# Patient Record
Sex: Male | Born: 1945 | Hispanic: No | Marital: Single | Smoking: Never smoker
Health system: Southern US, Community
[De-identification: ages and names within clinical notes are randomized; demographics above are authoritative.]

## PROBLEM LIST (undated history)

## (undated) DIAGNOSIS — F419 Anxiety disorder, unspecified: Secondary | ICD-10-CM

## (undated) DIAGNOSIS — G252 Other specified forms of tremor: Secondary | ICD-10-CM

## (undated) DIAGNOSIS — E785 Hyperlipidemia, unspecified: Secondary | ICD-10-CM

## (undated) DIAGNOSIS — I10 Essential (primary) hypertension: Secondary | ICD-10-CM

## (undated) DIAGNOSIS — F32A Depression, unspecified: Secondary | ICD-10-CM

## (undated) HISTORY — DX: Anxiety disorder, unspecified: F41.9

## (undated) HISTORY — DX: Depression, unspecified: F32.A

## (undated) HISTORY — DX: Hyperlipidemia, unspecified: E78.5

## (undated) HISTORY — DX: Other specified forms of tremor: G25.2

## (undated) HISTORY — PX: EYE SURGERY: SHX253

## (undated) HISTORY — DX: Essential (primary) hypertension: I10

---

## 2003-11-21 ENCOUNTER — Other Ambulatory Visit: Admission: RE | Admit: 2003-11-21 | Discharge: 2003-11-21 | Payer: Self-pay | Admitting: Dermatology

## 2011-01-27 DIAGNOSIS — N400 Enlarged prostate without lower urinary tract symptoms: Secondary | ICD-10-CM | POA: Diagnosis not present

## 2011-01-27 DIAGNOSIS — E782 Mixed hyperlipidemia: Secondary | ICD-10-CM | POA: Diagnosis not present

## 2011-02-05 DIAGNOSIS — E782 Mixed hyperlipidemia: Secondary | ICD-10-CM | POA: Diagnosis not present

## 2011-02-05 DIAGNOSIS — N400 Enlarged prostate without lower urinary tract symptoms: Secondary | ICD-10-CM | POA: Diagnosis not present

## 2011-09-14 DIAGNOSIS — E782 Mixed hyperlipidemia: Secondary | ICD-10-CM | POA: Diagnosis not present

## 2011-09-14 DIAGNOSIS — R5381 Other malaise: Secondary | ICD-10-CM | POA: Diagnosis not present

## 2011-09-14 DIAGNOSIS — N400 Enlarged prostate without lower urinary tract symptoms: Secondary | ICD-10-CM | POA: Diagnosis not present

## 2011-09-14 DIAGNOSIS — Z79899 Other long term (current) drug therapy: Secondary | ICD-10-CM | POA: Diagnosis not present

## 2011-09-21 DIAGNOSIS — K5730 Diverticulosis of large intestine without perforation or abscess without bleeding: Secondary | ICD-10-CM | POA: Diagnosis not present

## 2011-09-21 DIAGNOSIS — Z23 Encounter for immunization: Secondary | ICD-10-CM | POA: Diagnosis not present

## 2011-09-21 DIAGNOSIS — Z0000 Encounter for general adult medical examination without abnormal findings: Secondary | ICD-10-CM | POA: Diagnosis not present

## 2011-09-21 DIAGNOSIS — E782 Mixed hyperlipidemia: Secondary | ICD-10-CM | POA: Diagnosis not present

## 2011-09-21 DIAGNOSIS — N400 Enlarged prostate without lower urinary tract symptoms: Secondary | ICD-10-CM | POA: Diagnosis not present

## 2012-01-11 DIAGNOSIS — Z23 Encounter for immunization: Secondary | ICD-10-CM | POA: Diagnosis not present

## 2012-03-14 DIAGNOSIS — N400 Enlarged prostate without lower urinary tract symptoms: Secondary | ICD-10-CM | POA: Diagnosis not present

## 2012-03-14 DIAGNOSIS — E782 Mixed hyperlipidemia: Secondary | ICD-10-CM | POA: Diagnosis not present

## 2012-09-08 DIAGNOSIS — Z0000 Encounter for general adult medical examination without abnormal findings: Secondary | ICD-10-CM | POA: Diagnosis not present

## 2012-09-08 DIAGNOSIS — N400 Enlarged prostate without lower urinary tract symptoms: Secondary | ICD-10-CM | POA: Diagnosis not present

## 2012-09-08 DIAGNOSIS — E785 Hyperlipidemia, unspecified: Secondary | ICD-10-CM | POA: Diagnosis not present

## 2012-09-08 DIAGNOSIS — E782 Mixed hyperlipidemia: Secondary | ICD-10-CM | POA: Diagnosis not present

## 2012-09-15 DIAGNOSIS — Z0000 Encounter for general adult medical examination without abnormal findings: Secondary | ICD-10-CM | POA: Diagnosis not present

## 2012-09-15 DIAGNOSIS — N400 Enlarged prostate without lower urinary tract symptoms: Secondary | ICD-10-CM | POA: Diagnosis not present

## 2012-09-15 DIAGNOSIS — E782 Mixed hyperlipidemia: Secondary | ICD-10-CM | POA: Diagnosis not present

## 2012-10-27 DIAGNOSIS — Z23 Encounter for immunization: Secondary | ICD-10-CM | POA: Diagnosis not present

## 2013-03-08 DIAGNOSIS — N400 Enlarged prostate without lower urinary tract symptoms: Secondary | ICD-10-CM | POA: Diagnosis not present

## 2013-03-08 DIAGNOSIS — E782 Mixed hyperlipidemia: Secondary | ICD-10-CM | POA: Diagnosis not present

## 2013-03-08 DIAGNOSIS — K5730 Diverticulosis of large intestine without perforation or abscess without bleeding: Secondary | ICD-10-CM | POA: Diagnosis not present

## 2013-03-31 DIAGNOSIS — E782 Mixed hyperlipidemia: Secondary | ICD-10-CM | POA: Diagnosis not present

## 2013-03-31 DIAGNOSIS — N400 Enlarged prostate without lower urinary tract symptoms: Secondary | ICD-10-CM | POA: Diagnosis not present

## 2013-09-26 DIAGNOSIS — E782 Mixed hyperlipidemia: Secondary | ICD-10-CM | POA: Diagnosis not present

## 2013-09-26 DIAGNOSIS — E559 Vitamin D deficiency, unspecified: Secondary | ICD-10-CM | POA: Diagnosis not present

## 2013-09-26 DIAGNOSIS — N400 Enlarged prostate without lower urinary tract symptoms: Secondary | ICD-10-CM | POA: Diagnosis not present

## 2013-09-26 DIAGNOSIS — K5730 Diverticulosis of large intestine without perforation or abscess without bleeding: Secondary | ICD-10-CM | POA: Diagnosis not present

## 2013-10-10 DIAGNOSIS — R160 Hepatomegaly, not elsewhere classified: Secondary | ICD-10-CM | POA: Diagnosis not present

## 2013-10-10 DIAGNOSIS — Z048 Encounter for examination and observation for other specified reasons: Secondary | ICD-10-CM | POA: Diagnosis not present

## 2013-10-12 DIAGNOSIS — H2513 Age-related nuclear cataract, bilateral: Secondary | ICD-10-CM | POA: Diagnosis not present

## 2014-03-13 DIAGNOSIS — E782 Mixed hyperlipidemia: Secondary | ICD-10-CM | POA: Diagnosis not present

## 2014-09-20 DIAGNOSIS — E782 Mixed hyperlipidemia: Secondary | ICD-10-CM | POA: Diagnosis not present

## 2014-09-20 DIAGNOSIS — Z0001 Encounter for general adult medical examination with abnormal findings: Secondary | ICD-10-CM | POA: Diagnosis not present

## 2015-02-02 DIAGNOSIS — F321 Major depressive disorder, single episode, moderate: Secondary | ICD-10-CM | POA: Diagnosis not present

## 2015-03-14 DIAGNOSIS — E782 Mixed hyperlipidemia: Secondary | ICD-10-CM | POA: Diagnosis not present

## 2015-03-14 DIAGNOSIS — R160 Hepatomegaly, not elsewhere classified: Secondary | ICD-10-CM | POA: Diagnosis not present

## 2015-03-14 DIAGNOSIS — N400 Enlarged prostate without lower urinary tract symptoms: Secondary | ICD-10-CM | POA: Diagnosis not present

## 2015-05-21 DIAGNOSIS — H2513 Age-related nuclear cataract, bilateral: Secondary | ICD-10-CM | POA: Diagnosis not present

## 2015-09-16 DIAGNOSIS — N400 Enlarged prostate without lower urinary tract symptoms: Secondary | ICD-10-CM | POA: Diagnosis not present

## 2015-09-16 DIAGNOSIS — E782 Mixed hyperlipidemia: Secondary | ICD-10-CM | POA: Diagnosis not present

## 2015-09-16 DIAGNOSIS — R160 Hepatomegaly, not elsewhere classified: Secondary | ICD-10-CM | POA: Diagnosis not present

## 2015-09-26 DIAGNOSIS — I071 Rheumatic tricuspid insufficiency: Secondary | ICD-10-CM | POA: Diagnosis not present

## 2015-09-26 DIAGNOSIS — R0602 Shortness of breath: Secondary | ICD-10-CM | POA: Diagnosis not present

## 2015-09-26 DIAGNOSIS — R6889 Other general symptoms and signs: Secondary | ICD-10-CM | POA: Diagnosis not present

## 2016-04-10 DIAGNOSIS — G4700 Insomnia, unspecified: Secondary | ICD-10-CM | POA: Diagnosis not present

## 2016-04-10 DIAGNOSIS — Z8659 Personal history of other mental and behavioral disorders: Secondary | ICD-10-CM | POA: Diagnosis not present

## 2016-04-10 DIAGNOSIS — Z77118 Contact with and (suspected) exposure to other environmental pollution: Secondary | ICD-10-CM | POA: Diagnosis not present

## 2016-04-10 DIAGNOSIS — E7800 Pure hypercholesterolemia, unspecified: Secondary | ICD-10-CM | POA: Diagnosis not present

## 2016-04-10 DIAGNOSIS — N400 Enlarged prostate without lower urinary tract symptoms: Secondary | ICD-10-CM | POA: Diagnosis not present

## 2016-04-10 DIAGNOSIS — K5792 Diverticulitis of intestine, part unspecified, without perforation or abscess without bleeding: Secondary | ICD-10-CM | POA: Diagnosis not present

## 2016-05-14 DIAGNOSIS — Z575 Occupational exposure to toxic agents in other industries: Secondary | ICD-10-CM | POA: Diagnosis not present

## 2016-05-21 DIAGNOSIS — T5691XD Toxic effect of unspecified metal, accidental (unintentional), subsequent encounter: Secondary | ICD-10-CM | POA: Diagnosis not present

## 2016-05-21 DIAGNOSIS — D649 Anemia, unspecified: Secondary | ICD-10-CM | POA: Diagnosis not present

## 2016-05-21 DIAGNOSIS — D509 Iron deficiency anemia, unspecified: Secondary | ICD-10-CM | POA: Diagnosis not present

## 2016-10-09 DIAGNOSIS — Z6822 Body mass index (BMI) 22.0-22.9, adult: Secondary | ICD-10-CM | POA: Diagnosis not present

## 2016-10-09 DIAGNOSIS — E539 Vitamin B deficiency, unspecified: Secondary | ICD-10-CM | POA: Diagnosis not present

## 2016-10-09 DIAGNOSIS — R899 Unspecified abnormal finding in specimens from other organs, systems and tissues: Secondary | ICD-10-CM | POA: Diagnosis not present

## 2016-10-09 DIAGNOSIS — Z712 Person consulting for explanation of examination or test findings: Secondary | ICD-10-CM | POA: Diagnosis not present

## 2017-10-07 DIAGNOSIS — Z23 Encounter for immunization: Secondary | ICD-10-CM | POA: Diagnosis not present

## 2017-11-05 DIAGNOSIS — I5032 Chronic diastolic (congestive) heart failure: Secondary | ICD-10-CM | POA: Diagnosis not present

## 2018-10-12 DIAGNOSIS — H2513 Age-related nuclear cataract, bilateral: Secondary | ICD-10-CM | POA: Diagnosis not present

## 2019-02-18 DIAGNOSIS — Z23 Encounter for immunization: Secondary | ICD-10-CM | POA: Diagnosis not present

## 2019-03-18 DIAGNOSIS — Z23 Encounter for immunization: Secondary | ICD-10-CM | POA: Diagnosis not present

## 2019-06-29 DIAGNOSIS — H2512 Age-related nuclear cataract, left eye: Secondary | ICD-10-CM | POA: Diagnosis not present

## 2019-06-29 DIAGNOSIS — H25013 Cortical age-related cataract, bilateral: Secondary | ICD-10-CM | POA: Diagnosis not present

## 2019-06-29 DIAGNOSIS — H25043 Posterior subcapsular polar age-related cataract, bilateral: Secondary | ICD-10-CM | POA: Diagnosis not present

## 2019-06-29 DIAGNOSIS — H31012 Macula scars of posterior pole (postinflammatory) (post-traumatic), left eye: Secondary | ICD-10-CM | POA: Diagnosis not present

## 2019-06-29 DIAGNOSIS — H2513 Age-related nuclear cataract, bilateral: Secondary | ICD-10-CM | POA: Diagnosis not present

## 2019-08-01 DIAGNOSIS — H25812 Combined forms of age-related cataract, left eye: Secondary | ICD-10-CM | POA: Diagnosis not present

## 2019-08-01 DIAGNOSIS — H2512 Age-related nuclear cataract, left eye: Secondary | ICD-10-CM | POA: Diagnosis not present

## 2019-10-03 DIAGNOSIS — Z1211 Encounter for screening for malignant neoplasm of colon: Secondary | ICD-10-CM | POA: Insufficient documentation

## 2019-10-20 DIAGNOSIS — Z01818 Encounter for other preprocedural examination: Secondary | ICD-10-CM | POA: Diagnosis not present

## 2019-10-23 DIAGNOSIS — Z1211 Encounter for screening for malignant neoplasm of colon: Secondary | ICD-10-CM | POA: Diagnosis not present

## 2019-10-23 DIAGNOSIS — Z7982 Long term (current) use of aspirin: Secondary | ICD-10-CM | POA: Diagnosis not present

## 2019-10-23 DIAGNOSIS — K922 Gastrointestinal hemorrhage, unspecified: Secondary | ICD-10-CM | POA: Diagnosis not present

## 2019-10-23 DIAGNOSIS — D128 Benign neoplasm of rectum: Secondary | ICD-10-CM | POA: Diagnosis not present

## 2019-10-23 DIAGNOSIS — E785 Hyperlipidemia, unspecified: Secondary | ICD-10-CM | POA: Diagnosis not present

## 2019-10-23 DIAGNOSIS — K5730 Diverticulosis of large intestine without perforation or abscess without bleeding: Secondary | ICD-10-CM | POA: Diagnosis not present

## 2019-10-23 DIAGNOSIS — Z79899 Other long term (current) drug therapy: Secondary | ICD-10-CM | POA: Diagnosis not present

## 2019-10-23 DIAGNOSIS — K621 Rectal polyp: Secondary | ICD-10-CM | POA: Diagnosis not present

## 2019-11-07 DIAGNOSIS — D128 Benign neoplasm of rectum: Secondary | ICD-10-CM | POA: Diagnosis not present

## 2019-11-07 DIAGNOSIS — K529 Noninfective gastroenteritis and colitis, unspecified: Secondary | ICD-10-CM | POA: Diagnosis not present

## 2019-12-12 DIAGNOSIS — Z23 Encounter for immunization: Secondary | ICD-10-CM | POA: Diagnosis not present

## 2019-12-12 DIAGNOSIS — I10 Essential (primary) hypertension: Secondary | ICD-10-CM | POA: Diagnosis not present

## 2019-12-12 DIAGNOSIS — G20 Parkinson's disease: Secondary | ICD-10-CM | POA: Diagnosis not present

## 2019-12-12 DIAGNOSIS — F321 Major depressive disorder, single episode, moderate: Secondary | ICD-10-CM | POA: Diagnosis not present

## 2019-12-12 DIAGNOSIS — I5032 Chronic diastolic (congestive) heart failure: Secondary | ICD-10-CM | POA: Diagnosis not present

## 2019-12-12 DIAGNOSIS — D126 Benign neoplasm of colon, unspecified: Secondary | ICD-10-CM | POA: Diagnosis not present

## 2019-12-12 DIAGNOSIS — E782 Mixed hyperlipidemia: Secondary | ICD-10-CM | POA: Diagnosis not present

## 2019-12-12 DIAGNOSIS — K922 Gastrointestinal hemorrhage, unspecified: Secondary | ICD-10-CM | POA: Diagnosis not present

## 2019-12-15 DIAGNOSIS — Z23 Encounter for immunization: Secondary | ICD-10-CM | POA: Diagnosis not present

## 2020-02-19 DIAGNOSIS — H31092 Other chorioretinal scars, left eye: Secondary | ICD-10-CM | POA: Diagnosis not present

## 2020-02-22 ENCOUNTER — Other Ambulatory Visit: Payer: Self-pay | Admitting: *Deleted

## 2020-02-22 ENCOUNTER — Encounter: Payer: Self-pay | Admitting: *Deleted

## 2020-02-23 ENCOUNTER — Encounter: Payer: Self-pay | Admitting: Neurology

## 2020-02-23 ENCOUNTER — Ambulatory Visit (INDEPENDENT_AMBULATORY_CARE_PROVIDER_SITE_OTHER): Payer: Medicare Other | Admitting: Neurology

## 2020-02-23 VITALS — BP 143/85 | HR 69 | Ht 62.0 in | Wt 139.0 lb

## 2020-02-23 DIAGNOSIS — R251 Tremor, unspecified: Secondary | ICD-10-CM

## 2020-02-23 MED ORDER — CARBIDOPA-LEVODOPA 25-250 MG PO TABS: 1.0000 | ORAL_TABLET | Freq: Three times a day (TID) | ORAL | 6 refills | Status: DC

## 2020-02-23 NOTE — Progress Notes (Signed)
Chief Complaint  Patient presents with  . New Patient (Initial Visit)    Pt with daughter, new room. He has been having tremors in his hands, this has been going on for 3 yrs. Unsure what initially started. Tremor stays mostly in hands. Was started on carbidopa levodopa which did help but had stopped and it worsened. So he restarted it. And does help calm. They are wanting to assess the cause behind the tremor.  Has not had imaging completed.   Marland Kitchen Referred    Dr [name]    HISTORICAL  Devin Gomez 75 year old, seen in request by [name] E evaluation of bilateral hand tremor is accompanied by his daughter at today's clinical visit on February 23, 2020  I reviewed and summarized the referring note.  Past medical history Hyperlipidemia Insomnia Depression  He lives with his son at home, daughter reported stressful situation over the past years, around 2019 he developed hand tremor, left side is more noticeable than the right side, he also complains of mildly decreased sense of smell, mild unsteady gait, no constipation, he has irregular sleep pattern, denies REM sleep disorder  He denies memory loss, which is confirmed by his daughter, does complains of anxiety  He was given prescription of Sinemet 25/100 mg up to 3 times a day, which has helped his symptoms greatly, but he complains of dizziness with 3 dose, he often only take 1 in the morning after breakfast, 1 before he goes to bed at 5 PM   REVIEW OF SYSTEMS: Full 14 system review of systems performed and notable only for as above All other review of systems were negative.  ALLERGIES: Not on File  HOME MEDICATIONS: Current Outpatient Medications  Medication Sig Dispense Refill  . atorvastatin (LIPITOR) 20 MG tablet daily.    . carbidopa-levodopa (SINEMET IR) 25-250 MG tablet Take 1 tablet by mouth 3 (three) times daily.    Marland Kitchen dutasteride (AVODART) 0.5 MG capsule TAKE 1 CAPSULE BY  MOUTH EVERY DAY FOR ENLARGED PROSTATE    . escitalopram (LEXAPRO) 10 MG tablet daily.    . traZODone (DESYREL) 100 MG tablet Take 1 tablet by mouth at bedtime as needed.     No current facility-administered medications for this visit.    PAST MEDICAL HISTORY: Past Medical History:  Diagnosis Date  . Anxiety and depression   . Hyperlipidemia   . Hypertension   . Resting tremor     PAST SURGICAL HISTORY: The histories are not reviewed yet. Please review them in the "History" navigator section and refresh this [place].  FAMILY HISTORY: Family History  Problem Relation Age of Onset  . Pancreatic cancer Father   . Leukemia Brother        age 59  . Colon cancer Maternal Grandmother     SOCIAL HISTORY: Social History   Socioeconomic History  . Marital status: Single    Spouse name: Not on file  . Number of children: Not on file  . Years of education: Not on file  . Highest education level: Not on file  Occupational History  . Not on file  Tobacco Use  . Smoking status: Never Smoker  . Smokeless tobacco: Never Used  Substance and Sexual Activity  . Alcohol use: Yes  . Drug use: Never  . Sexual activity: Not on file  Other Topics Concern  . Not on file  Social History Narrative  . Not on file   Social Determinants of  Health   Financial Resource Strain: Not on file  Food Insecurity: Not on file  Transportation Needs: Not on file  Physical Activity: Not on file  Stress: Not on file  Social Connections: Not on file  Intimate Partner Violence: Not on file     PHYSICAL EXAM   Vitals:   02/23/20 0918  BP: (!) 143/85  Pulse: 69  Weight: 139 lb (63 kg)  Height: 5\' 2"  (1.575 m)   Not recorded     Body mass index is 25.42 kg/m.  PHYSICAL EXAMNIATION:  Gen: NAD, conversant, well nourised, well groomed                     Cardiovascular: Regular rate rhythm, no peripheral edema, warm, nontender. Eyes: Conjunctivae clear without exudates or  hemorrhage Neck: Supple, no carotid bruits. Pulmonary: Clear to auscultation bilaterally   NEUROLOGICAL EXAM:  MENTAL STATUS: Speech:    Speech is normal; fluent and spontaneous with normal comprehension.  Cognition:     Orientation to time, place and person     Normal recent and remote memory     Normal Attention span and concentration     Normal Language, naming, repeating,spontaneous speech     Fund of knowledge   CRANIAL NERVES: CN II: Visual fields are full to confrontation. Pupils are round equal and briskly reactive to light. CN III, IV, VI: extraocular movement are normal. No ptosis. CN V: Facial sensation is intact to light touch CN VII: Face is symmetric with normal eye closure  CN VIII: Hearing is normal to causal conversation. CN IX, X: Phonation is normal. CN XI: Head turning and shoulder shrug are intact  MOTOR: Frequent left more than right hand resting tremor, there was no significant positional component, no weakness, mild rigidity, mild bradykinesia, left worse than right   REFLEXES: Reflexes are 2+ and symmetric at the biceps, triceps, knees, and ankles. Plantar responses are flexor.  SENSORY: Intact to light touch, pinprick and vibratory sensation are intact in fingers and toes.  COORDINATION: There is no trunk or limb dysmetria noted.  GAIT/STANCE: He can get up from seated position arm crossed, moderate stride, smooth turning, steady   DIAGNOSTIC DATA (LABS, IMAGING, TESTING) - I reviewed patient records, labs, notes, testing and imaging myself where available.   ASSESSMENT AND PLAN  Devin Gomez is a 75 y.o. male   3 years history of gradual onset left more than right hand resting tremor,  Only mild parkinsonian features such as rigidity bradykinesia on examination other than the resting tremor,  Reported significant of his tremor taking Sinemet even low-dose 25/100 mg twice a day  I have encouraged him moderate exercise, may take Sinemet  25/100 up to 3 times a day  Complete evaluation with MRI of brain, DaTSCAN of brain  Laboratory evaluation including thyroid functional test    Marcial Pacas, M.D. Ph.D.  [facility] [address], [place], [place] [address] Ph: [phone] Fax: [phone]  CC:  Curlene Labrum, MD [address] [place],  [place] [address]

## 2020-02-24 LAB — CBC WITH DIFFERENTIAL
Basophils Absolute: 0 10*3/uL (ref 0.0–0.2)
Basos: 1 %
EOS (ABSOLUTE): 0.2 10*3/uL (ref 0.0–0.4)
Eos: 3 %
Hematocrit: 40.5 % (ref 37.5–51.0)
Hemoglobin: 14.2 g/dL (ref 13.0–17.7)
Immature Grans (Abs): 0 10*3/uL (ref 0.0–0.1)
Immature Granulocytes: 0 %
Lymphocytes Absolute: 2.3 10*3/uL (ref 0.7–3.1)
Lymphs: 36 %
MCH: 32.8 pg (ref 26.6–33.0)
MCHC: 35.1 g/dL (ref 31.5–35.7)
MCV: 94 fL (ref 79–97)
Monocytes Absolute: 0.7 10*3/uL (ref 0.1–0.9)
Monocytes: 11 %
Neutrophils Absolute: 3.2 10*3/uL (ref 1.4–7.0)
Neutrophils: 49 %
RBC: 4.33 x10E6/uL (ref 4.14–5.80)
RDW: 12.2 % (ref 11.6–15.4)
WBC: 6.4 10*3/uL (ref 3.4–10.8)

## 2020-02-24 LAB — COMPREHENSIVE METABOLIC PANEL
ALT: 44 IU/L (ref 0–44)
AST: 32 IU/L (ref 0–40)
Albumin/Globulin Ratio: 2 (ref 1.2–2.2)
Albumin: 4.7 g/dL (ref 3.7–4.7)
Alkaline Phosphatase: 101 IU/L (ref 44–121)
BUN/Creatinine Ratio: 10 (ref 10–24)
BUN: 10 mg/dL (ref 8–27)
Bilirubin Total: 0.3 mg/dL (ref 0.0–1.2)
CO2: 23 mmol/L (ref 20–29)
Calcium: 9.3 mg/dL (ref 8.6–10.2)
Chloride: 98 mmol/L (ref 96–106)
Creatinine, Ser: 0.96 mg/dL (ref 0.76–1.27)
GFR calc Af Amer: 88 mL/min/{1.73_m2} (ref >59)
GFR calc non Af Amer: 76 mL/min/{1.73_m2} (ref >59)
Globulin, Total: 2.3 g/dL (ref 1.5–4.5)
Glucose: 98 mg/dL (ref 65–99)
Potassium: 4.7 mmol/L (ref 3.5–5.2)
Sodium: 138 mmol/L (ref 134–144)
Total Protein: 7 g/dL (ref 6.0–8.5)

## 2020-02-24 LAB — TSH: TSH: 2.11 u[IU]/mL (ref 0.450–4.500)

## 2020-02-24 LAB — C-REACTIVE PROTEIN: CRP: <1 mg/L (ref 0–10)

## 2020-02-24 LAB — SEDIMENTATION RATE: Sed Rate: 2 mm/hr (ref 0–30)

## 2020-02-24 LAB — CK: Total CK: 110 U/L (ref 41–331)

## 2020-02-24 LAB — ANA W/REFLEX IF POSITIVE: Anti Nuclear Antibody (ANA): NEGATIVE

## 2020-02-26 ENCOUNTER — Telehealth: Payer: Self-pay | Admitting: *Deleted

## 2020-02-26 ENCOUNTER — Telehealth: Payer: Self-pay | Admitting: Neurology

## 2020-02-26 NOTE — Telephone Encounter (Signed)
Left patient a detailed message, with results, on voicemail.  Provided our number to call back with any questions.

## 2020-02-26 NOTE — Telephone Encounter (Signed)
-----   Message from Marcial Pacas, MD sent at 02/26/2020  8:40 AM EST ----- Please call patient for normal laboratory result

## 2020-02-26 NOTE — Telephone Encounter (Signed)
Medicare/medicaid order sent to GI. No auth they will reach out to the patient to schedule.  °

## 2020-03-15 ENCOUNTER — Other Ambulatory Visit: Payer: Medicare Other

## 2020-03-20 ENCOUNTER — Ambulatory Visit
Admission: RE | Admit: 2020-03-20 | Discharge: 2020-03-20 | Disposition: A | Discharge status: Home / Self Care | Payer: Medicare Other | Source: Ambulatory Visit | Attending: Neurology | Admitting: Neurology

## 2020-03-20 ENCOUNTER — Other Ambulatory Visit: Payer: Self-pay | Admitting: Neurology

## 2020-03-20 DIAGNOSIS — R251 Tremor, unspecified: Secondary | ICD-10-CM

## 2020-03-20 DIAGNOSIS — M795 Residual foreign body in soft tissue: Secondary | ICD-10-CM

## 2020-03-20 DIAGNOSIS — Z01818 Encounter for other preprocedural examination: Secondary | ICD-10-CM | POA: Diagnosis not present

## 2020-03-22 ENCOUNTER — Telehealth: Payer: Self-pay | Admitting: Neurology

## 2020-03-22 NOTE — Telephone Encounter (Signed)
   IMPRESSION:   MRI brain (without) demonstrating: -Mild to moderate atrophy and mild ventriculomegaly on ex vacuo basis. -Mild chronic small vessel ischemic disease. -No acute findings.  Please call patient, MRI of the brain showed significant atrophy, moderate supratentorium small vessel disease,  will review MRI at next follow-up visit

## 2020-03-25 NOTE — Telephone Encounter (Signed)
I spoke to the patient's daughter on DPR Meridee Score) and she verbalized understanding of the MRI findings. She will relay the information to her father.

## 2020-03-28 ENCOUNTER — Encounter (HOSPITAL_COMMUNITY): Admission: RE | Admit: 2020-03-28 | Payer: Medicare Other | Source: Ambulatory Visit

## 2020-03-28 ENCOUNTER — Ambulatory Visit (HOSPITAL_COMMUNITY): Admission: RE | Admit: 2020-03-28 | Payer: Medicare Other | Source: Ambulatory Visit

## 2020-06-17 DIAGNOSIS — I10 Essential (primary) hypertension: Secondary | ICD-10-CM | POA: Diagnosis not present

## 2020-06-17 DIAGNOSIS — I5032 Chronic diastolic (congestive) heart failure: Secondary | ICD-10-CM | POA: Diagnosis not present

## 2020-06-17 DIAGNOSIS — E7849 Other hyperlipidemia: Secondary | ICD-10-CM | POA: Diagnosis not present

## 2020-06-17 DIAGNOSIS — E782 Mixed hyperlipidemia: Secondary | ICD-10-CM | POA: Diagnosis not present

## 2020-07-23 ENCOUNTER — Ambulatory Visit: Payer: Medicare Other | Admitting: Neurology

## 2020-08-26 ENCOUNTER — Other Ambulatory Visit: Payer: Self-pay | Admitting: Neurology

## 2020-08-30 ENCOUNTER — Other Ambulatory Visit: Payer: Self-pay | Admitting: Neurology

## 2020-09-04 DIAGNOSIS — H31092 Other chorioretinal scars, left eye: Secondary | ICD-10-CM | POA: Diagnosis not present

## 2020-09-18 DIAGNOSIS — E7849 Other hyperlipidemia: Secondary | ICD-10-CM | POA: Diagnosis not present

## 2020-09-18 DIAGNOSIS — I10 Essential (primary) hypertension: Secondary | ICD-10-CM | POA: Diagnosis not present

## 2020-09-18 DIAGNOSIS — E782 Mixed hyperlipidemia: Secondary | ICD-10-CM | POA: Diagnosis not present

## 2020-09-18 DIAGNOSIS — E559 Vitamin D deficiency, unspecified: Secondary | ICD-10-CM | POA: Diagnosis not present

## 2020-09-18 DIAGNOSIS — I5032 Chronic diastolic (congestive) heart failure: Secondary | ICD-10-CM | POA: Diagnosis not present

## 2020-12-27 DIAGNOSIS — E7849 Other hyperlipidemia: Secondary | ICD-10-CM | POA: Diagnosis not present

## 2020-12-27 DIAGNOSIS — I10 Essential (primary) hypertension: Secondary | ICD-10-CM | POA: Diagnosis not present

## 2020-12-27 DIAGNOSIS — E782 Mixed hyperlipidemia: Secondary | ICD-10-CM | POA: Diagnosis not present

## 2020-12-27 DIAGNOSIS — D519 Vitamin B12 deficiency anemia, unspecified: Secondary | ICD-10-CM | POA: Diagnosis not present

## 2021-02-08 ENCOUNTER — Encounter (HOSPITAL_COMMUNITY): Payer: Self-pay | Admitting: *Deleted

## 2021-02-08 ENCOUNTER — Emergency Department (HOSPITAL_COMMUNITY): Payer: Medicare Other

## 2021-02-08 ENCOUNTER — Emergency Department (HOSPITAL_COMMUNITY)
Admission: EM | Admit: 2021-02-08 | Discharge: 2021-02-08 | Disposition: A | Discharge status: Home / Self Care | Payer: Medicare Other | Attending: Emergency Medicine | Admitting: Emergency Medicine

## 2021-02-08 ENCOUNTER — Other Ambulatory Visit: Payer: Self-pay

## 2021-02-08 DIAGNOSIS — Z79899 Other long term (current) drug therapy: Secondary | ICD-10-CM | POA: Diagnosis not present

## 2021-02-08 DIAGNOSIS — G252 Other specified forms of tremor: Secondary | ICD-10-CM

## 2021-02-08 DIAGNOSIS — G20 Parkinson's disease: Secondary | ICD-10-CM | POA: Diagnosis not present

## 2021-02-08 DIAGNOSIS — R531 Weakness: Secondary | ICD-10-CM | POA: Diagnosis not present

## 2021-02-08 DIAGNOSIS — R42 Dizziness and giddiness: Secondary | ICD-10-CM | POA: Diagnosis not present

## 2021-02-08 DIAGNOSIS — M79642 Pain in left hand: Secondary | ICD-10-CM | POA: Diagnosis not present

## 2021-02-08 DIAGNOSIS — I10 Essential (primary) hypertension: Secondary | ICD-10-CM | POA: Diagnosis not present

## 2021-02-08 DIAGNOSIS — M19042 Primary osteoarthritis, left hand: Secondary | ICD-10-CM | POA: Diagnosis not present

## 2021-02-08 DIAGNOSIS — R251 Tremor, unspecified: Secondary | ICD-10-CM | POA: Insufficient documentation

## 2021-02-08 DIAGNOSIS — R9431 Abnormal electrocardiogram [ECG] [EKG]: Secondary | ICD-10-CM | POA: Diagnosis not present

## 2021-02-08 DIAGNOSIS — S6992XA Unspecified injury of left wrist, hand and finger(s), initial encounter: Secondary | ICD-10-CM | POA: Diagnosis not present

## 2021-02-08 LAB — CBC WITH DIFFERENTIAL/PLATELET
Abs Immature Granulocytes: 0.01 10*3/uL (ref 0.00–0.07)
Basophils Absolute: 0 10*3/uL (ref 0.0–0.1)
Basophils Relative: 1 %
Eosinophils Absolute: 0.1 10*3/uL (ref 0.0–0.5)
Eosinophils Relative: 1 %
HCT: 39.6 % (ref 39.0–52.0)
Hemoglobin: 13.4 g/dL (ref 13.0–17.0)
Immature Granulocytes: 0 %
Lymphocytes Relative: 30 %
Lymphs Abs: 2 10*3/uL (ref 0.7–4.0)
MCH: 32.3 pg (ref 26.0–34.0)
MCHC: 33.8 g/dL (ref 30.0–36.0)
MCV: 95.4 fL (ref 80.0–100.0)
Monocytes Absolute: 0.7 10*3/uL (ref 0.1–1.0)
Monocytes Relative: 10 %
Neutro Abs: 3.8 10*3/uL (ref 1.7–7.7)
Neutrophils Relative %: 58 %
Platelets: 163 10*3/uL (ref 150–400)
RBC: 4.15 MIL/uL — ABNORMAL LOW (ref 4.22–5.81)
RDW: 12 % (ref 11.5–15.5)
WBC: 6.5 10*3/uL (ref 4.0–10.5)
nRBC: 0 % (ref 0.0–0.2)

## 2021-02-08 LAB — COMPREHENSIVE METABOLIC PANEL
ALT: 28 U/L (ref 0–44)
AST: 30 U/L (ref 15–41)
Albumin: 3.9 g/dL (ref 3.5–5.0)
Alkaline Phosphatase: 56 U/L (ref 38–126)
Anion gap: 5 (ref 5–15)
BUN: 12 mg/dL (ref 8–23)
CO2: 28 mmol/L (ref 22–32)
Calcium: 8.9 mg/dL (ref 8.9–10.3)
Chloride: 103 mmol/L (ref 98–111)
Creatinine, Ser: 0.77 mg/dL (ref 0.61–1.24)
GFR, Estimated: >60 mL/min (ref >60)
Glucose, Bld: 120 mg/dL — ABNORMAL HIGH (ref 70–99)
Potassium: 3.3 mmol/L — ABNORMAL LOW (ref 3.5–5.1)
Sodium: 136 mmol/L (ref 135–145)
Total Bilirubin: 0.3 mg/dL (ref 0.3–1.2)
Total Protein: 6.7 g/dL (ref 6.5–8.1)

## 2021-02-08 LAB — URINALYSIS, ROUTINE W REFLEX MICROSCOPIC
Bacteria, UA: NONE SEEN
Bilirubin Urine: NEGATIVE
Glucose, UA: NEGATIVE mg/dL
Ketones, ur: NEGATIVE mg/dL
Nitrite: NEGATIVE
Protein, ur: NEGATIVE mg/dL
Specific Gravity, Urine: 1.015 (ref 1.005–1.030)
pH: 7 (ref 5.0–8.0)

## 2021-02-08 LAB — MAGNESIUM: Magnesium: 2 mg/dL (ref 1.7–2.4)

## 2021-02-08 LAB — CBG MONITORING, ED: Glucose-Capillary: 102 mg/dL — ABNORMAL HIGH (ref 70–99)

## 2021-02-08 MED ORDER — HYDROXYZINE HCL 50 MG/ML IM SOLN: 100.0000 mg | Freq: Once | INTRAMUSCULAR | Status: DC

## 2021-02-08 MED ORDER — SODIUM CHLORIDE 0.9 % IV BOLUS
500.0000 mL | Freq: Once | INTRAVENOUS | Status: AC
  Administered 2021-02-08: 500 mL via INTRAVENOUS

## 2021-02-08 MED ORDER — POTASSIUM CHLORIDE 10 MEQ/100ML IV SOLN
10.0000 meq | Freq: Once | INTRAVENOUS | Status: AC
  Administered 2021-02-08: 10 meq via INTRAVENOUS
  Filled 2021-02-08: qty 100

## 2021-02-08 MED ORDER — POTASSIUM CHLORIDE CRYS ER 20 MEQ PO TBCR
40.0000 meq | EXTENDED_RELEASE_TABLET | Freq: Once | ORAL | Status: AC
  Administered 2021-02-08: 40 meq via ORAL
  Filled 2021-02-08: qty 2

## 2021-02-08 MED ORDER — LORAZEPAM 2 MG/ML IJ SOLN: 2.0000 mg | Freq: Once | INTRAMUSCULAR | Status: DC

## 2021-02-08 MED ORDER — CARBIDOPA-LEVODOPA 25-250 MG PO TABS: 1.0000 | ORAL_TABLET | Freq: Three times a day (TID) | ORAL | 0 refills | Status: DC

## 2021-02-08 NOTE — ED Provider Notes (Signed)
°  Face-to-face evaluation   History: Patient presents for gradually worse problems with "balance," which causes him to fall.  He also complains of dizziness and constant tremor.  He was treated for Parkinson's symptoms with medication until 6 months ago when he stopped because it made him "more dizzy."  He went to his PCP office today, who had previously prescribed a Parkinson's medication but decided to come here for further evaluation at the recommendation of his PCP.  Physical exam: Alert, alert and calm.  No dysarthria or aphasia.  Heart regular rate and rhythm without murmur.  Lungs clear to auscultation.  Abdomen soft and nontender.  He speaks and moves slowly.  No gross deformity of the extremities.  There are some bruising of the fingers 3 4 and 5 of the left hand.  There is fairly normal range of motion of the left hand.  He does not have muscular rigidity.  He has symmetric strength and can independently raise arms and legs and hold them off the stretcher easily.  He follows commands accurately.  MDM: Evaluation for  Chief Complaint  Patient presents with   Weakness     Elderly appearing male, with persistent tremor, mostly left arm at this time.  His tremor is nonspecific and he has some signs of Parkinson's disease but not completely diagnostic by history or clinical examination.  He will likely need evaluation by neurology.  Screening evaluation does not indicate stroke or obvious CNS abnormality at this time.  No metabolic or infectious etiology appreciated on laboratory testing.   Medical screening examination/treatment/procedure(s) were conducted as a shared visit with non-physician practitioner(s) and myself.  I personally evaluated the patient during the encounter    Daleen Bo, MD 02/09/21 [phone]

## 2021-02-08 NOTE — ED Triage Notes (Signed)
Pt has had increased weakness for last 3 months, last month has been worse with more frequent, fell Monday and yesterday. Appetite ok, no N/V/D

## 2021-02-08 NOTE — ED Provider Notes (Signed)
[facility] DEPT Provider Note   CSN: [id number] Arrival date & time: 02/08/21  1434     History  Chief Complaint  Patient presents with   Weakness    Devin Gomez is a 76 y.o. male who presents to the emergency department for increased weakness for the last 3 months.  Patient states that last month has been significantly worse with more frequent falls.  Most recently fell on Monday as well as yesterday, yesterday's fall included head trauma but no loss of consciousness.  Patient still complaining of some left hand pain after his fall on Monday.  Patient denies any recent illness.  Upon chart review patient was previously on carbidopa levodopa for resting tremor likely related to Parkinson's.  Patient had a initial neurology appointment with some concerning signs of Parkinson's, was post to have a full evaluation but did not obtain this.  He has not been on any tremor medication in several months as he did not like how they made him feel.   Weakness Associated symptoms: urgency   Associated symptoms: no abdominal pain, no chest pain, no cough, no diarrhea, no dysuria, no fever, no frequency, no nausea, no seizures, no shortness of breath and no vomiting      Past Medical History:  Diagnosis Date   Anxiety and depression    Hyperlipidemia    Hypertension    Resting tremor      Home Medications Prior to Admission medications   Medication Sig Start Date End Date Taking? Authorizing Provider  atorvastatin (LIPITOR) 20 MG tablet Take 20 mg by mouth daily.   Yes [provider]  dutasteride (AVODART) 0.5 MG capsule TAKE 1 CAPSULE BY MOUTH EVERY DAY FOR ENLARGED PROSTATE 08/30/19  Yes [provider]  escitalopram (LEXAPRO) 10 MG tablet Take 10 mg by mouth daily.   Yes [provider]  mometasone (ELOCON) 0.1 % cream Apply 1 application topically daily. 01/09/21  Yes [provider]  Multiple Vitamins-Minerals  (MULTIVITAMIN ADULTS 50+) TABS Take 1 tablet by mouth daily.   Yes [provider]  carbidopa-levodopa (SINEMET IR) 25-250 MG tablet Take 1 tablet by mouth 3 (three) times daily. MUST HAVE FOLLOW UP FOR FURTHER REFILLS. 02/08/21   Jalonda Antigua T, PA-C      Allergies    Patient has no known allergies.    Review of Systems   Review of Systems  Constitutional:  Negative for chills and fever.  HENT:  Negative for congestion.   Respiratory:  Negative for cough and shortness of breath.   Cardiovascular:  Negative for chest pain.  Gastrointestinal:  Negative for abdominal pain, constipation, diarrhea, nausea and vomiting.  Genitourinary:  Positive for urgency. Negative for dysuria, frequency, hematuria and penile swelling.  Musculoskeletal:  Positive for gait problem.  Neurological:  Positive for tremors and weakness. Negative for seizures and syncope.  Psychiatric/Behavioral:  Positive for sleep disturbance. Negative for suicidal ideas.        Depression/hopelessness  All other systems reviewed and are negative.  Physical Exam Updated Vital Signs BP (!) 159/95    Pulse 84    Temp 97.7 F (36.5 C) (Oral)    Resp 17    Ht 5\' 2"  (1.575 m)    Wt 56.7 kg    SpO2 98%    BMI 22.86 kg/m  Physical Exam Vitals and nursing note reviewed.  Constitutional:      Appearance: Normal appearance.  HENT:     Head: Normocephalic and  atraumatic.  Eyes:     Conjunctiva/sclera: Conjunctivae normal.  Cardiovascular:     Rate and Rhythm: Normal rate and regular rhythm.  Pulmonary:     Effort: Pulmonary effort is normal. No respiratory distress.     Breath sounds: Normal breath sounds.  Abdominal:     General: There is no distension.     Palpations: Abdomen is soft.     Tenderness: There is no abdominal tenderness.  Skin:    General: Skin is warm and dry.  Neurological:     General: No focal deficit present.     Mental Status: He is alert.     Comments: Neuro: Speech is clear but soft.  Able to follow commands. CN III-XII intact grossly intact. PERRLA. EOMI. Sensation intact throughout. Str 5/5 all extremities. Tremor in bilateral hands, worse with intentional activity.     ED Results / Procedures / Treatments   Labs (all labs ordered are listed, but only abnormal results are displayed) Labs Reviewed  URINALYSIS, ROUTINE W REFLEX MICROSCOPIC - Abnormal; Notable for the following components:      Result Value   Color, Urine STRAW (*)    Hgb urine dipstick SMALL (*)    Leukocytes,Ua TRACE (*)    All other components within normal limits  COMPREHENSIVE METABOLIC PANEL - Abnormal; Notable for the following components:   Potassium 3.3 (*)    Glucose, Bld 120 (*)    All other components within normal limits  CBC WITH DIFFERENTIAL/PLATELET - Abnormal; Notable for the following components:   RBC 4.15 (*)    All other components within normal limits  CBG MONITORING, ED - Abnormal; Notable for the following components:   Glucose-Capillary 102 (*)    All other components within normal limits  MAGNESIUM    EKG None  Radiology DG Chest 2 View  Result Date: 02/08/2021 CLINICAL DATA:  Increased weakness. EXAM: CHEST - 2 VIEW COMPARISON:  None. FINDINGS: The heart is normal in size. Atherosclerotic calcification of aortic arch. Low lung volumes. Retrocardiac density, which may represent atelectasis or infiltrate. No acute osseous abnormality. IMPRESSION: Retrocardiac density, which may represent atelectasis or infiltrate. Electronically Signed   By: Keane Police D.O.   On: 02/08/2021 16:10   CT Head Wo Contrast  Result Date: 02/08/2021 CLINICAL DATA:  Head trauma. Increase weakness for 3 months. Status post fall on Monday and yesterday. EXAM: CT HEAD WITHOUT CONTRAST TECHNIQUE: Contiguous axial images were obtained from the base of the skull through the vertex without intravenous contrast. RADIATION DOSE REDUCTION: This exam was performed according to the departmental  dose-optimization program which includes automated exposure control, adjustment of the mA and/or kV according to patient size and/or use of iterative reconstruction technique. COMPARISON:  MRI brain 03/20/2020 FINDINGS: Brain: No evidence of acute infarction, hemorrhage, hydrocephalus, extra-axial collection or mass lesion/mass effect. There is mild diffuse low-attenuation within the subcortical and periventricular white matter compatible with chronic microvascular disease. Prominence of sulci and ventricles compatible with brain atrophy. Vascular: No hyperdense vessel or unexpected calcification. Skull: Normal. Negative for fracture or focal lesion. Sinuses/Orbits: No acute finding. Other: None IMPRESSION: 1. No acute intracranial abnormalities. 2. Chronic small vessel ischemic disease and brain atrophy. Electronically Signed   By: Kerby Moors M.D.   On: 02/08/2021 15:32   DG Hand Complete Left  Result Date: 02/08/2021 CLINICAL DATA:  Injury.  Increased weakness for 3 months. EXAM: LEFT HAND - COMPLETE 3+ VIEW COMPARISON:  None FINDINGS: No acute fracture or dislocation. Bones  appear osteopenic. Scattered areas of degenerative change noted involving the basilar joint, DIP joints and second and third MCP joints. Soft tissues are unremarkable. IMPRESSION: 1. No acute findings. 2. Osteoarthritis. Electronically Signed   By: Kerby Moors M.D.   On: 02/08/2021 16:12    Procedures Procedures    Medications Ordered in ED Medications  sodium chloride 0.9 % bolus 500 mL (0 mLs Intravenous Stopped 02/08/21 2018)  potassium chloride 10 mEq in 100 mL IVPB (0 mEq Intravenous Stopped 02/08/21 2018)  potassium chloride SA (KLOR-CON M) CR tablet 40 mEq (40 mEq Oral Given 02/08/21 2016)    ED Course/ Medical Decision Making/ A&P Clinical Course as of 02/08/21 2334  Sat Feb 08, 2021  1624 DG Hand Complete Left [EW]    Clinical Course User Index [EW] Daleen Bo, MD                           Medical  Decision Making Amount and/or Complexity of Data Reviewed Labs: ordered. Radiology: ordered. Decision-making details documented in ED Course.  Risk Prescription drug management.   This patient presents to the ED for concern of weakness, this involves an extensive number of treatment options, and is a complaint that carries with it a high risk of complications and morbidity. The emergent differential diagnosis includes, but is not limited to, neurologic causes (GBS, myasthenia gravis, CVA, MS, ALS, transverse myelitis, spinal cord injury, CVA, botulism, ) and other causes: ACS, Arrhythmia, syncope, orthostatic hypotension, sepsis, hypoglycemia, electrolyte disturbance, hypothyroidism, respiratory failure, symptomatic anemia, dehydration, heat injury, polypharmacy, malignancy.   Co morbidities that complicate the patient evaluation: Resting tremor with parkinsonian features, anxiety and depression, hyperlipidemia, hypertension Social Determinants of Health: Patient lives alone  Additional history obtained from daughter. External records from outside source obtained and reviewed including previous neurology note.  Physical Exam: Physical exam performed. The pertinent findings include: Patient has persistent tremor of bilateral hands, worse with intention movement.  No obvious muscle rigidity.  Normal strength in all extremities, and otherwise neurologically intact as above.  Lab Tests: I Ordered, and personally interpreted labs.  The pertinent results include: No leukocytosis, mild hypokalemia 3.3, urinalysis benign, hemoglobin normal, electrolytes grossly within normal limits.   Imaging Studies: I ordered imaging studies including CT head, chest x-ray, left hand x-ray. I independently visualized and interpreted imaging which showed no acute fracture dislocations, no cranial abnormalities. I agree with the radiologist interpretation.   Cardiac Monitoring:  The patient was maintained on a  cardiac monitor.  My attending physician Dr. Eulis Foster viewed and interpreted the cardiac monitored which showed an underlying rhythm of: normal sinus rhythm.    Medications: I ordered medication including IV fluids and potassium  for hypokalemia. Reevaluation of the patient after these medicines. I have reviewed the patients home medicines and have made adjustments as needed.  Consultations Obtained: I requested consultation with the neurologist Dr. Lorrin Goodell,  and discussed lab and imaging findings as well as pertinent plan - they recommend: Restarting patient's previous prescription of carbidopa levodopa, and ensuring follow-up with the neurologist for full Parkinson's work-up.  I also requested consultation with social work.  And have put in orders for home health to include physical therapy, Occupational Therapy, home health aide.   Dispostion: After consideration of the diagnostic results and the patients response to treatment, I feel that patient does not require admission or inpatient treatment for his symptoms.  I think his symptoms are likely related to a  gradual decline both cognitive and physical function. I discussed at length the importance of restarting his medication and obtaining neurology follow up, and patient is agreeable to this. We discussed reasons to return to the ER, and patient is agreeable to the plan.   I discussed this case with my attending physician Dr. Eulis Foster who cosigned this note including patient's presenting symptoms, physical exam, and planned diagnostics and interventions. Attending physician stated agreement with plan or made changes to plan which were implemented.    Final Clinical Impression(s) / ED Diagnoses Final diagnoses:  Weakness  Resting tremor    Rx / DC Orders ED Discharge Orders          Ordered    carbidopa-levodopa (SINEMET IR) 25-250 MG tablet  3 times daily        02/08/21 2141           Portions of this report may have been  transcribed using voice recognition software. Every effort was made to ensure accuracy; however, inadvertent computerized transcription errors may be present.     Estill Cotta 02/08/21 2334    Daleen Bo, MD 02/09/21 [phone]

## 2021-02-08 NOTE — ED Notes (Signed)
Pt provided water and sandwich

## 2021-02-08 NOTE — Discharge Instructions (Addendum)
You were seen in the emergency department for weakness.  As we discussed, and the neurologist recommended, is that you restart the carbidopa levodopa medication.  This is incredibly important as it will help treat your tremor, and prevent any further falls.  It is also very important that you follow-up with your neurologist, and receive that full Parkinson evaluation.  If you have a documented diagnosis, this can make access to resources easier.  In the interim I have not only consulted our social worker, but I have also put in an order for home health. You are on the list to have a home evaluation for physical therapy, occupational therapy, and a home health aide. They will come to the home and evaluate exactly where they can be of assistance and set up a schedule if needed. They should call you either tomorrow or Monday to set this up.

## 2021-02-10 ENCOUNTER — Telehealth: Payer: Self-pay | Admitting: Neurology

## 2021-02-10 NOTE — Telephone Encounter (Addendum)
We were able to get him scheduled in an open slot on 02/11/21 at 1pm (check-in 12:30). I left this information on his daughter's voicemail. I told her to only call back if she needs to reschedule. Otherwise, we will see the patient at that time for further evaluation of his care. If it's not convenient, I told her to call me and we will look for an alternate date.

## 2021-02-10 NOTE — Telephone Encounter (Signed)
Ok to move up his appt, may add on for Feb 8th, Wed

## 2021-02-10 NOTE — Telephone Encounter (Signed)
Pt;s daughter, Meridee Score, he stop taking carbidopa-levodopa (SINEMET IR) 25-250 MG tablet in September because of the side effect and since has declined. Last 5 weeks he has falling a number of times. Saturday morning; 02/08/21 to see PCP, instructed pt to go ER. ER said did not have a reason to admit him to the hospital, instructed pt to see your neurologist and to start taking the carbidopa-levodopa (SINEMET IR) 25-250 MG tablet Pt started taking the medication on Sunday,07/09/21. Would like call from the nurse to discuss a sooner then 02/25/21.

## 2021-02-10 NOTE — Telephone Encounter (Signed)
Daughter called back to say 02-07 with 12:30 check In time will work, this is FYI no call back requested

## 2021-02-11 ENCOUNTER — Telehealth (INDEPENDENT_AMBULATORY_CARE_PROVIDER_SITE_OTHER): Payer: Medicare Other | Admitting: Neurology

## 2021-02-11 ENCOUNTER — Encounter: Payer: Self-pay | Admitting: Neurology

## 2021-02-11 ENCOUNTER — Ambulatory Visit: Payer: Medicare Other | Admitting: Neurology

## 2021-02-11 VITALS — BP 137/78 | HR 94 | Ht 62.0 in | Wt 126.0 lb

## 2021-02-11 DIAGNOSIS — R269 Unspecified abnormalities of gait and mobility: Secondary | ICD-10-CM | POA: Diagnosis not present

## 2021-02-11 DIAGNOSIS — G20 Parkinson's disease: Secondary | ICD-10-CM

## 2021-02-11 DIAGNOSIS — F03C0 Unspecified dementia, severe, without behavioral disturbance, psychotic disturbance, mood disturbance, and anxiety: Secondary | ICD-10-CM

## 2021-02-11 MED ORDER — CARBIDOPA-LEVODOPA 25-250 MG PO TABS: 1.0000 | ORAL_TABLET | Freq: Three times a day (TID) | ORAL | 12 refills | Status: DC

## 2021-02-11 NOTE — Telephone Encounter (Signed)
Make sure to send social worker to talk with family

## 2021-02-11 NOTE — Progress Notes (Signed)
Chief Complaint  Patient presents with   Follow-up    Rm 15. PCP is Dr. Judd Gomez. Accompanied by daughter. Falls, unbalanced, worsening tremors. [name], fell and hit head. C/o weakness and dizziness. C/o urinary incontinence. Experiencing hopelessness. Has difficulty feeding himself, washing his hands after going to the restroom. Daughter is concerned about pt living alone.    HISTORICAL  ODAY Gomez 76 year old, seen in request by his primary care physician Dr. Judd Gomez E evaluation of bilateral hand tremor is accompanied by his daughter at Devin's clinical visit on February 23, 2020  I reviewed and summarized the referring note.  Past medical history Hyperlipidemia Insomnia Depression  He lives with his son at home, daughter reported stressful situation over the past years, around 2019 he developed hand tremor, left side is more noticeable than the right side, he also complains of mildly decreased sense of smell, mild unsteady gait, no constipation, he has irregular sleep pattern, denies REM sleep disorder  He denies memory loss, which is confirmed by his daughter, does complains of anxiety  He was given prescription of Sinemet 25/100 mg up to 3 times a day, which has helped his symptoms greatly, but he complains of dizziness with 3 dose, he often only take 1 in the morning after breakfast, 1 before he goes to bed at 5 PM   UPDATE Feb 11 2021: He is brought in by his daughter at Devin's visit, he still lives alone, but noticed increased difficulty, presented to emergency room February 08, 2021 after he fell, complains of increased weakness, he was also noted to have increased gait abnormality MoCA examination is only 6/30 Devin  He was given prescription of Sinemet 25/250 mg 3 times a day, was not sure about the benefit,  Daughter agreed that he is no longer safe staying by himself, eager to find a solution for rehabilitation even long-term  placement,   I personally reviewed MRI of the brain without contrast in March 2022, and also CT head in February 2023: Mild to moderate generalized atrophy, mild ventriculomegaly, mild small vessel disease, no acute abnormality  Laboratory evaluation normal TSH, see reactive protein, CPK, ANA, ESR, CBC, CMP in February 2022, hemoglobin 13.4 in February 2023, CMP, creatinine 0.77, potassium 3.3   REVIEW OF SYSTEMS: Full 14 system review of systems performed and notable only for as above All other review of systems were negative.  ALLERGIES: No Known Allergies  HOME MEDICATIONS: Current Outpatient Medications  Medication Sig Dispense Refill   atorvastatin (LIPITOR) 20 MG tablet Take 20 mg by mouth daily.     carbidopa-levodopa (SINEMET IR) 25-250 MG tablet Take 1 tablet by mouth 3 (three) times daily. MUST HAVE FOLLOW UP FOR FURTHER REFILLS. 90 tablet 0   dutasteride (AVODART) 0.5 MG capsule TAKE 1 CAPSULE BY MOUTH EVERY DAY FOR ENLARGED PROSTATE     escitalopram (LEXAPRO) 10 MG tablet Take 10 mg by mouth daily.     mometasone (ELOCON) 0.1 % cream Apply 1 application topically daily.     Multiple Vitamins-Minerals (MULTIVITAMIN ADULTS 50+) TABS Take 1 tablet by mouth daily.     No current facility-administered medications for this visit.    PAST MEDICAL HISTORY: Past Medical History:  Diagnosis Date   Anxiety and depression    Hyperlipidemia    Hypertension    Resting tremor     PAST SURGICAL HISTORY: The histories are not reviewed yet. Please review them in the "History" navigator section and  refresh this [name].  FAMILY HISTORY: Family History  Problem Relation Age of Onset   Pancreatic cancer Father    Leukemia Brother        age 69   Colon cancer Maternal Grandmother     SOCIAL HISTORY: Social History   Socioeconomic History   Marital status: Single    Spouse name: Not on file   Number of children: Not on file   Years of education: Not on file   Highest  education level: Not on file  Occupational History   Not on file  Tobacco Use   Smoking status: Never   Smokeless tobacco: Never  Substance and Sexual Activity   Alcohol use: Yes   Drug use: Never   Sexual activity: Not on file  Other Topics Concern   Not on file  Social History Narrative   Not on file   Social Determinants of Health   Financial Resource Strain: Not on file  Food Insecurity: Not on file  Transportation Needs: Not on file  Physical Activity: Not on file  Stress: Not on file  Social Connections: Not on file  Intimate Partner Violence: Not on file     PHYSICAL EXAM   Vitals:   02/11/21 1301  BP: 137/78  Pulse: 94  Weight: 126 lb (57.2 kg)  Height: '5\' 2"'  (1.575 m)   Not recorded     Body mass index is 23.05 kg/m.  PHYSICAL EXAMNIATION:  Gen: NAD, conversant, well nourised, well groomed                     Cardiovascular: Regular rate rhythm, no peripheral edema, warm, nontender. Eyes: Conjunctivae clear without exudates or hemorrhage Neck: Supple, no carotid bruits. Pulmonary: Clear to auscultation bilaterally   NEUROLOGICAL EXAM:  MENTAL STATUS: Unkempt Speech:    Speech is normal; fluent and spontaneous with normal comprehension.  Cognition: Montreal Cognitive Assessment  02/11/2021  Visuospatial/ Executive (0/5) 0  Naming (0/3) 3  Attention: Read list of digits (0/2) 1  Attention: Read list of letters (0/1) 0  Attention: Serial 7 subtraction starting at 100 (0/3) 0  Language: Repeat phrase (0/2) 1  Language : Fluency (0/1) 0  Abstraction (0/2) 0  Delayed Recall (0/5) 0  Orientation (0/6) 1  Total 6      CRANIAL NERVES: CN II: Visual fields are full to confrontation. Pupils are round equal and briskly reactive to light. CN III, IV, VI: extraocular movement are normal. No ptosis. CN V: Facial sensation is intact to light touch CN VII: Face is symmetric with normal eye closure  CN VIII: Hearing is normal to causal  conversation. CN IX, X: Phonation is normal. CN XI: Head turning and shoulder shrug are intact  MOTOR: Frequent left more than right hand resting tremor, also mild positional component, no weakness, mild rigidity, mild bradykinesia, left worse than right   REFLEXES: Reflexes are 2+ and symmetric at the biceps, triceps, knees, and ankles. Plantar responses are flexor.  SENSORY: Intact to light touch, pinprick and vibratory sensation are intact in fingers and toes.  COORDINATION: There is no trunk or limb dysmetria noted.  GAIT/STANCE: He needs to push on chair arm to get up from seated position, leaning forward, unsteady, cautious gait     DIAGNOSTIC DATA (LABS, IMAGING, TESTING) - I reviewed patient records, labs, notes, testing and imaging myself where available.   ASSESSMENT AND PLAN  Devin Gomez is a 76 y.o. male   Central nervous system  degenerative disorder,  With some evident dementia, MoCA 6/30, mild parkinsonian features,  Poor historian, more than likely dementia documented in the picture, difficulty ambulating, parkinsonian features came on later, this will be most consistent with a diagnosis of Alzheimer's disease versus Lewy body dementia, he denies significant hallucinations  Worsening gait abnormality,  Patient only has mild improvement in his gait abnormality with titrating dose of Sinemet  Recent ER visit, imaging, and laboratory evaluation showed no treatable etiology,  More than likely, as time goes by, he will continue to experience worsening functional decline, he would benefit home physical therapy, social worker to help with long-term care plan,       Marcial Pacas, M.D. Ph.D.  [facility] [address] [place], [place] [address] Ph: [phone] Fax: [phone]  CC:  Curlene Labrum, MD [address] [place],  [place] [address]

## 2021-02-12 ENCOUNTER — Telehealth: Payer: Self-pay | Admitting: Neurology

## 2021-02-12 NOTE — Telephone Encounter (Signed)
No need for OT

## 2021-02-12 NOTE — Telephone Encounter (Signed)
I spoke to his daughter, Meridee Score, and provided the information below.

## 2021-02-12 NOTE — Telephone Encounter (Signed)
Please call his daughter, she was asking potentially inpatient rehabilitation/placement for her father,  Let her try service below:  [phone]?

## 2021-02-12 NOTE — Telephone Encounter (Signed)
Sent  message to Marjory Lies with [facility]  to see if he would be able to take the patient. I also asked him to see a Education officer, museum to talk with the family.

## 2021-02-12 NOTE — Telephone Encounter (Signed)
Marjory Lies stated ' I see aide with PT. Ok to add OT?"

## 2021-02-12 NOTE — Telephone Encounter (Signed)
Noted, I sent Marjory Lies a messaged informing him no need for OT.

## 2021-02-17 ENCOUNTER — Telehealth: Payer: Self-pay | Admitting: Neurology

## 2021-02-17 DIAGNOSIS — F32A Depression, unspecified: Secondary | ICD-10-CM | POA: Diagnosis not present

## 2021-02-17 DIAGNOSIS — F419 Anxiety disorder, unspecified: Secondary | ICD-10-CM | POA: Diagnosis not present

## 2021-02-17 DIAGNOSIS — G20 Parkinson's disease: Secondary | ICD-10-CM | POA: Diagnosis not present

## 2021-02-17 DIAGNOSIS — E785 Hyperlipidemia, unspecified: Secondary | ICD-10-CM | POA: Diagnosis not present

## 2021-02-17 DIAGNOSIS — I10 Essential (primary) hypertension: Secondary | ICD-10-CM | POA: Diagnosis not present

## 2021-02-17 DIAGNOSIS — G4700 Insomnia, unspecified: Secondary | ICD-10-CM | POA: Diagnosis not present

## 2021-02-17 DIAGNOSIS — F02C0 Dementia in other diseases classified elsewhere, severe, without behavioral disturbance, psychotic disturbance, mood disturbance, and anxiety: Secondary | ICD-10-CM | POA: Diagnosis not present

## 2021-02-17 NOTE — Telephone Encounter (Signed)
At 1:27 PT Senaida Ores @ [facility] called to report she did PT start of care today and is requesting verbal orders for 2 week 4 and 1 week 4 for physical therapy.  She is also requesting Home Health Aid for 2 weeks starting next week.  Her phone # is [phone] vm is secure if a message needs to be left.

## 2021-02-17 NOTE — Telephone Encounter (Signed)
I called Devin Gomez and gave VOs for PT and [facility] aide.

## 2021-02-20 ENCOUNTER — Ambulatory Visit: Payer: Medicare Other | Admitting: Neurology

## 2021-02-26 DIAGNOSIS — I10 Essential (primary) hypertension: Secondary | ICD-10-CM | POA: Diagnosis not present

## 2021-02-26 DIAGNOSIS — F02C0 Dementia in other diseases classified elsewhere, severe, without behavioral disturbance, psychotic disturbance, mood disturbance, and anxiety: Secondary | ICD-10-CM | POA: Diagnosis not present

## 2021-02-26 DIAGNOSIS — G4700 Insomnia, unspecified: Secondary | ICD-10-CM | POA: Diagnosis not present

## 2021-02-26 DIAGNOSIS — G20 Parkinson's disease: Secondary | ICD-10-CM | POA: Diagnosis not present

## 2021-02-26 DIAGNOSIS — E785 Hyperlipidemia, unspecified: Secondary | ICD-10-CM | POA: Diagnosis not present

## 2021-02-26 DIAGNOSIS — F32A Depression, unspecified: Secondary | ICD-10-CM | POA: Diagnosis not present

## 2021-02-28 DIAGNOSIS — F32A Depression, unspecified: Secondary | ICD-10-CM | POA: Diagnosis not present

## 2021-02-28 DIAGNOSIS — I10 Essential (primary) hypertension: Secondary | ICD-10-CM | POA: Diagnosis not present

## 2021-02-28 DIAGNOSIS — F02C0 Dementia in other diseases classified elsewhere, severe, without behavioral disturbance, psychotic disturbance, mood disturbance, and anxiety: Secondary | ICD-10-CM | POA: Diagnosis not present

## 2021-02-28 DIAGNOSIS — E785 Hyperlipidemia, unspecified: Secondary | ICD-10-CM | POA: Diagnosis not present

## 2021-02-28 DIAGNOSIS — G20 Parkinson's disease: Secondary | ICD-10-CM | POA: Diagnosis not present

## 2021-02-28 DIAGNOSIS — G4700 Insomnia, unspecified: Secondary | ICD-10-CM | POA: Diagnosis not present

## 2021-03-03 ENCOUNTER — Other Ambulatory Visit: Payer: Self-pay | Admitting: Neurology

## 2021-03-04 DIAGNOSIS — F02C0 Dementia in other diseases classified elsewhere, severe, without behavioral disturbance, psychotic disturbance, mood disturbance, and anxiety: Secondary | ICD-10-CM | POA: Diagnosis not present

## 2021-03-04 DIAGNOSIS — G4700 Insomnia, unspecified: Secondary | ICD-10-CM | POA: Diagnosis not present

## 2021-03-04 DIAGNOSIS — G20 Parkinson's disease: Secondary | ICD-10-CM | POA: Diagnosis not present

## 2021-03-04 DIAGNOSIS — F32A Depression, unspecified: Secondary | ICD-10-CM | POA: Diagnosis not present

## 2021-03-04 DIAGNOSIS — E785 Hyperlipidemia, unspecified: Secondary | ICD-10-CM | POA: Diagnosis not present

## 2021-03-04 DIAGNOSIS — I10 Essential (primary) hypertension: Secondary | ICD-10-CM | POA: Diagnosis not present

## 2021-03-06 DIAGNOSIS — G20 Parkinson's disease: Secondary | ICD-10-CM | POA: Diagnosis not present

## 2021-03-06 DIAGNOSIS — G4700 Insomnia, unspecified: Secondary | ICD-10-CM | POA: Diagnosis not present

## 2021-03-06 DIAGNOSIS — F32A Depression, unspecified: Secondary | ICD-10-CM | POA: Diagnosis not present

## 2021-03-06 DIAGNOSIS — F02C0 Dementia in other diseases classified elsewhere, severe, without behavioral disturbance, psychotic disturbance, mood disturbance, and anxiety: Secondary | ICD-10-CM | POA: Diagnosis not present

## 2021-03-06 DIAGNOSIS — E785 Hyperlipidemia, unspecified: Secondary | ICD-10-CM | POA: Diagnosis not present

## 2021-03-06 DIAGNOSIS — I10 Essential (primary) hypertension: Secondary | ICD-10-CM | POA: Diagnosis not present

## 2021-03-12 DIAGNOSIS — F02C0 Dementia in other diseases classified elsewhere, severe, without behavioral disturbance, psychotic disturbance, mood disturbance, and anxiety: Secondary | ICD-10-CM | POA: Diagnosis not present

## 2021-03-12 DIAGNOSIS — G20 Parkinson's disease: Secondary | ICD-10-CM | POA: Diagnosis not present

## 2021-03-12 DIAGNOSIS — I10 Essential (primary) hypertension: Secondary | ICD-10-CM | POA: Diagnosis not present

## 2021-03-12 DIAGNOSIS — E785 Hyperlipidemia, unspecified: Secondary | ICD-10-CM | POA: Diagnosis not present

## 2021-03-12 DIAGNOSIS — G4700 Insomnia, unspecified: Secondary | ICD-10-CM | POA: Diagnosis not present

## 2021-03-12 DIAGNOSIS — F32A Depression, unspecified: Secondary | ICD-10-CM | POA: Diagnosis not present

## 2021-03-13 DIAGNOSIS — F02C0 Dementia in other diseases classified elsewhere, severe, without behavioral disturbance, psychotic disturbance, mood disturbance, and anxiety: Secondary | ICD-10-CM | POA: Diagnosis not present

## 2021-03-13 DIAGNOSIS — F32A Depression, unspecified: Secondary | ICD-10-CM | POA: Diagnosis not present

## 2021-03-13 DIAGNOSIS — E785 Hyperlipidemia, unspecified: Secondary | ICD-10-CM | POA: Diagnosis not present

## 2021-03-13 DIAGNOSIS — I10 Essential (primary) hypertension: Secondary | ICD-10-CM | POA: Diagnosis not present

## 2021-03-13 DIAGNOSIS — G4700 Insomnia, unspecified: Secondary | ICD-10-CM | POA: Diagnosis not present

## 2021-03-13 DIAGNOSIS — G20 Parkinson's disease: Secondary | ICD-10-CM | POA: Diagnosis not present

## 2021-03-19 DIAGNOSIS — G20 Parkinson's disease: Secondary | ICD-10-CM | POA: Diagnosis not present

## 2021-03-19 DIAGNOSIS — F02C0 Dementia in other diseases classified elsewhere, severe, without behavioral disturbance, psychotic disturbance, mood disturbance, and anxiety: Secondary | ICD-10-CM | POA: Diagnosis not present

## 2021-03-19 DIAGNOSIS — I10 Essential (primary) hypertension: Secondary | ICD-10-CM | POA: Diagnosis not present

## 2021-03-19 DIAGNOSIS — E785 Hyperlipidemia, unspecified: Secondary | ICD-10-CM | POA: Diagnosis not present

## 2021-03-19 DIAGNOSIS — G4700 Insomnia, unspecified: Secondary | ICD-10-CM | POA: Diagnosis not present

## 2021-03-19 DIAGNOSIS — F32A Depression, unspecified: Secondary | ICD-10-CM | POA: Diagnosis not present

## 2021-03-19 DIAGNOSIS — F419 Anxiety disorder, unspecified: Secondary | ICD-10-CM | POA: Diagnosis not present

## 2021-03-21 DIAGNOSIS — I10 Essential (primary) hypertension: Secondary | ICD-10-CM | POA: Diagnosis not present

## 2021-03-21 DIAGNOSIS — G4700 Insomnia, unspecified: Secondary | ICD-10-CM | POA: Diagnosis not present

## 2021-03-21 DIAGNOSIS — E785 Hyperlipidemia, unspecified: Secondary | ICD-10-CM | POA: Diagnosis not present

## 2021-03-21 DIAGNOSIS — G20 Parkinson's disease: Secondary | ICD-10-CM | POA: Diagnosis not present

## 2021-03-21 DIAGNOSIS — F32A Depression, unspecified: Secondary | ICD-10-CM | POA: Diagnosis not present

## 2021-03-21 DIAGNOSIS — F02C0 Dementia in other diseases classified elsewhere, severe, without behavioral disturbance, psychotic disturbance, mood disturbance, and anxiety: Secondary | ICD-10-CM | POA: Diagnosis not present

## 2021-03-28 DIAGNOSIS — F02C0 Dementia in other diseases classified elsewhere, severe, without behavioral disturbance, psychotic disturbance, mood disturbance, and anxiety: Secondary | ICD-10-CM | POA: Diagnosis not present

## 2021-03-28 DIAGNOSIS — F32A Depression, unspecified: Secondary | ICD-10-CM | POA: Diagnosis not present

## 2021-03-28 DIAGNOSIS — G20 Parkinson's disease: Secondary | ICD-10-CM | POA: Diagnosis not present

## 2021-03-28 DIAGNOSIS — I10 Essential (primary) hypertension: Secondary | ICD-10-CM | POA: Diagnosis not present

## 2021-03-28 DIAGNOSIS — E785 Hyperlipidemia, unspecified: Secondary | ICD-10-CM | POA: Diagnosis not present

## 2021-03-28 DIAGNOSIS — G4700 Insomnia, unspecified: Secondary | ICD-10-CM | POA: Diagnosis not present

## 2021-03-31 DIAGNOSIS — E782 Mixed hyperlipidemia: Secondary | ICD-10-CM | POA: Diagnosis not present

## 2021-03-31 DIAGNOSIS — Z1329 Encounter for screening for other suspected endocrine disorder: Secondary | ICD-10-CM | POA: Diagnosis not present

## 2021-03-31 DIAGNOSIS — E7849 Other hyperlipidemia: Secondary | ICD-10-CM | POA: Diagnosis not present

## 2021-03-31 DIAGNOSIS — I10 Essential (primary) hypertension: Secondary | ICD-10-CM | POA: Diagnosis not present

## 2021-04-04 DIAGNOSIS — F02C0 Dementia in other diseases classified elsewhere, severe, without behavioral disturbance, psychotic disturbance, mood disturbance, and anxiety: Secondary | ICD-10-CM | POA: Diagnosis not present

## 2021-04-04 DIAGNOSIS — F32A Depression, unspecified: Secondary | ICD-10-CM | POA: Diagnosis not present

## 2021-04-04 DIAGNOSIS — G4700 Insomnia, unspecified: Secondary | ICD-10-CM | POA: Diagnosis not present

## 2021-04-04 DIAGNOSIS — G20 Parkinson's disease: Secondary | ICD-10-CM | POA: Diagnosis not present

## 2021-04-04 DIAGNOSIS — I10 Essential (primary) hypertension: Secondary | ICD-10-CM | POA: Diagnosis not present

## 2021-04-04 DIAGNOSIS — E785 Hyperlipidemia, unspecified: Secondary | ICD-10-CM | POA: Diagnosis not present

## 2021-04-09 DIAGNOSIS — G20 Parkinson's disease: Secondary | ICD-10-CM | POA: Diagnosis not present

## 2021-04-09 DIAGNOSIS — F32A Depression, unspecified: Secondary | ICD-10-CM | POA: Diagnosis not present

## 2021-04-09 DIAGNOSIS — F02C0 Dementia in other diseases classified elsewhere, severe, without behavioral disturbance, psychotic disturbance, mood disturbance, and anxiety: Secondary | ICD-10-CM | POA: Diagnosis not present

## 2021-04-09 DIAGNOSIS — E785 Hyperlipidemia, unspecified: Secondary | ICD-10-CM | POA: Diagnosis not present

## 2021-04-09 DIAGNOSIS — I10 Essential (primary) hypertension: Secondary | ICD-10-CM | POA: Diagnosis not present

## 2021-04-09 DIAGNOSIS — G4700 Insomnia, unspecified: Secondary | ICD-10-CM | POA: Diagnosis not present

## 2021-06-04 DIAGNOSIS — E782 Mixed hyperlipidemia: Secondary | ICD-10-CM | POA: Diagnosis not present

## 2021-06-04 DIAGNOSIS — E7849 Other hyperlipidemia: Secondary | ICD-10-CM | POA: Diagnosis not present

## 2021-06-04 DIAGNOSIS — Z125 Encounter for screening for malignant neoplasm of prostate: Secondary | ICD-10-CM | POA: Diagnosis not present

## 2021-06-04 DIAGNOSIS — I10 Essential (primary) hypertension: Secondary | ICD-10-CM | POA: Diagnosis not present

## 2021-06-11 DIAGNOSIS — G20 Parkinson's disease: Secondary | ICD-10-CM | POA: Diagnosis not present

## 2021-06-11 DIAGNOSIS — Z6823 Body mass index (BMI) 23.0-23.9, adult: Secondary | ICD-10-CM | POA: Diagnosis not present

## 2021-06-11 DIAGNOSIS — S30861A Insect bite (nonvenomous) of abdominal wall, initial encounter: Secondary | ICD-10-CM | POA: Diagnosis not present

## 2021-06-11 DIAGNOSIS — I5032 Chronic diastolic (congestive) heart failure: Secondary | ICD-10-CM | POA: Diagnosis not present

## 2021-06-11 DIAGNOSIS — F321 Major depressive disorder, single episode, moderate: Secondary | ICD-10-CM | POA: Diagnosis not present

## 2021-09-11 DIAGNOSIS — F321 Major depressive disorder, single episode, moderate: Secondary | ICD-10-CM | POA: Diagnosis not present

## 2021-09-11 DIAGNOSIS — I5032 Chronic diastolic (congestive) heart failure: Secondary | ICD-10-CM | POA: Diagnosis not present

## 2021-09-11 DIAGNOSIS — G20 Parkinson's disease: Secondary | ICD-10-CM | POA: Diagnosis not present

## 2021-09-11 DIAGNOSIS — C44612 Basal cell carcinoma of skin of right upper limb, including shoulder: Secondary | ICD-10-CM | POA: Diagnosis not present

## 2021-09-11 DIAGNOSIS — G629 Polyneuropathy, unspecified: Secondary | ICD-10-CM | POA: Diagnosis not present

## 2021-09-11 DIAGNOSIS — Z6822 Body mass index (BMI) 22.0-22.9, adult: Secondary | ICD-10-CM | POA: Diagnosis not present

## 2021-09-11 DIAGNOSIS — R030 Elevated blood-pressure reading, without diagnosis of hypertension: Secondary | ICD-10-CM | POA: Diagnosis not present

## 2021-09-11 DIAGNOSIS — G319 Degenerative disease of nervous system, unspecified: Secondary | ICD-10-CM | POA: Diagnosis not present

## 2021-09-17 DIAGNOSIS — C44612 Basal cell carcinoma of skin of right upper limb, including shoulder: Secondary | ICD-10-CM | POA: Diagnosis not present

## 2021-09-17 DIAGNOSIS — Z6822 Body mass index (BMI) 22.0-22.9, adult: Secondary | ICD-10-CM | POA: Diagnosis not present

## 2021-10-31 DIAGNOSIS — R030 Elevated blood-pressure reading, without diagnosis of hypertension: Secondary | ICD-10-CM | POA: Diagnosis not present

## 2021-10-31 DIAGNOSIS — N471 Phimosis: Secondary | ICD-10-CM | POA: Diagnosis not present

## 2021-10-31 DIAGNOSIS — G629 Polyneuropathy, unspecified: Secondary | ICD-10-CM | POA: Diagnosis not present

## 2021-10-31 DIAGNOSIS — L723 Sebaceous cyst: Secondary | ICD-10-CM | POA: Diagnosis not present

## 2021-10-31 DIAGNOSIS — Z6823 Body mass index (BMI) 23.0-23.9, adult: Secondary | ICD-10-CM | POA: Diagnosis not present

## 2021-10-31 DIAGNOSIS — C44612 Basal cell carcinoma of skin of right upper limb, including shoulder: Secondary | ICD-10-CM | POA: Diagnosis not present

## 2021-10-31 DIAGNOSIS — Z23 Encounter for immunization: Secondary | ICD-10-CM | POA: Diagnosis not present

## 2021-10-31 DIAGNOSIS — G20C Parkinsonism, unspecified: Secondary | ICD-10-CM | POA: Diagnosis not present

## 2021-11-18 ENCOUNTER — Encounter: Payer: Self-pay | Admitting: Urology

## 2021-11-18 ENCOUNTER — Ambulatory Visit (INDEPENDENT_AMBULATORY_CARE_PROVIDER_SITE_OTHER): Payer: Medicare Other | Admitting: Urology

## 2021-11-18 ENCOUNTER — Ambulatory Visit: Payer: Medicare Other | Admitting: Urology

## 2021-11-18 VITALS — BP 152/89 | HR 80

## 2021-11-18 DIAGNOSIS — N471 Phimosis: Secondary | ICD-10-CM

## 2021-11-18 MED ORDER — CLOTRIMAZOLE-BETAMETHASONE 1-0.05 % EX CREA: 1.0000 | TOPICAL_CREAM | Freq: Two times a day (BID) | CUTANEOUS | 3 refills | Status: AC

## 2021-11-18 NOTE — Progress Notes (Deleted)
   Assessment: 1. Phimosis     Plan: ***  Chief Complaint: No chief complaint on file.   History of Present Illness:  Devin Gomez is a 76 y.o. male who is seen in consultation from [place], Virgina Evener, MD for evaluation of phimosis.   Past Medical History:  Past Medical History:  Diagnosis Date   Anxiety and depression    Hyperlipidemia    Hypertension    Resting tremor     Past Surgical History:  Past Surgical History:  Procedure Laterality Date   EYE SURGERY      Allergies:  No Known Allergies  Family History:  Family History  Problem Relation Age of Onset   Pancreatic cancer Father    Leukemia Brother        age 25   Colon cancer Maternal Grandmother     Social History:  Social History   Tobacco Use   Smoking status: Never   Smokeless tobacco: Never  Substance Use Topics   Alcohol use: Yes   Drug use: Never    Review of symptoms:  Constitutional:  Negative for unexplained weight loss, night sweats, fever, chills ENT:  Negative for nose bleeds, sinus pain, painful swallowing CV:  Negative for chest pain, shortness of breath, exercise intolerance, palpitations, loss of consciousness Resp:  Negative for cough, wheezing, shortness of breath GI:  Negative for nausea, vomiting, diarrhea, bloody stools GU:  Positives noted in HPI; otherwise negative for gross hematuria, dysuria, urinary incontinence Neuro:  Negative for seizures, poor balance, limb weakness, slurred speech Psych:  Negative for lack of energy, depression, anxiety Endocrine:  Negative for polydipsia, polyuria, symptoms of hypoglycemia (dizziness, hunger, sweating) Hematologic:  Negative for anemia, purpura, petechia, prolonged or excessive bleeding, use of anticoagulants  Allergic:  Negative for difficulty breathing or choking as a result of exposure to anything; no shellfish allergy; no allergic response (rash/itch) to materials, foods  Physical exam: There were no vitals taken for  this visit. GENERAL APPEARANCE:  Well appearing, well developed, well nourished, NAD HEENT: Atraumatic, Normocephalic, oropharynx clear. NECK: Supple without lymphadenopathy or thyromegaly. LUNGS: Clear to auscultation bilaterally. HEART: Regular Rate and Rhythm without murmurs, gallops, or rubs. ABDOMEN: Soft, non-tender, No Masses. EXTREMITIES: Moves all extremities well.  Without clubbing, cyanosis, or edema. NEUROLOGIC:  Alert and oriented x 3, normal gait, CN II-XII grossly intact.  MENTAL STATUS:  Appropriate. BACK:  Non-tender to palpation.  No CVAT SKIN:  Warm, dry and intact.   GU: Penis:  {Exam; penis:5791} Meatus: {Meatus:15530} Scrotum: {pe scrotum:310183} Testis: {Exam; testicles:5790} Epididymis: {epididymis VXYI:016553} Prostate: {Exam; prostate:5793} Rectum: {rectal exam:26517}   Results: U/A:

## 2021-11-18 NOTE — Progress Notes (Signed)
   Assessment: 1. Phimosis    Plan: Lotrisone ointment to foreskin BID  Return to office in 4-6 weeks Discussed surgical management with circumcision  Chief Complaint:  Chief Complaint  Patient presents with   phimosis    History of Present Illness:  Devin Gomez is a 76 y.o. male who is seen in consultation from Curlene Labrum, MD  for evaluation of phimosis. He reports difficulty retracting his foreskin for months.  No balanitis.  No paraphimosis.  He does not have any significant urinary symptoms.  No history of UTI's.     Past Medical History:  Past Medical History:  Diagnosis Date   Anxiety and depression    Hyperlipidemia    Hypertension    Resting tremor     Past Surgical History:  Past Surgical History:  Procedure Laterality Date   EYE SURGERY      Allergies:  No Known Allergies  Family History:  Family History  Problem Relation Age of Onset   Pancreatic cancer Father    Leukemia Brother        age 5   Colon cancer Maternal Grandmother     Social History:  Social History   Tobacco Use   Smoking status: Never   Smokeless tobacco: Never  Substance Use Topics   Alcohol use: Yes   Drug use: Never    Review of symptoms:  Constitutional:  Negative for unexplained weight loss, night sweats, fever, chills ENT:  Negative for nose bleeds, sinus pain, painful swallowing CV:  Negative for chest pain, shortness of breath, exercise intolerance, palpitations, loss of consciousness Resp:  Negative for cough, wheezing, shortness of breath GI:  Negative for nausea, vomiting, diarrhea, bloody stools GU:  Positives noted in HPI; otherwise negative for gross hematuria, dysuria, urinary incontinence Neuro:  Negative for seizures, poor balance, limb weakness, slurred speech Psych:  Negative for lack of energy, depression, anxiety Endocrine:  Negative for polydipsia, polyuria, symptoms of hypoglycemia (dizziness, hunger, sweating) Hematologic:  Negative  for anemia, purpura, petechia, prolonged or excessive bleeding, use of anticoagulants  Allergic:  Negative for difficulty breathing or choking as a result of exposure to anything; no shellfish allergy; no allergic response (rash/itch) to materials, foods  Physical exam: BP (!) 152/89   Pulse 80  GENERAL APPEARANCE:  Well appearing, well developed, well nourished, NAD HEENT: Atraumatic, Normocephalic, oropharynx clear. NECK: Supple without lymphadenopathy or thyromegaly. LUNGS: Clear to auscultation bilaterally. HEART: Regular Rate and Rhythm without murmurs, gallops, or rubs. ABDOMEN: Soft, non-tender, No Masses. EXTREMITIES: Moves all extremities well.  Without clubbing, cyanosis, or edema. NEUROLOGIC:  Alert and oriented x 3, normal gait, CN II-XII grossly intact.  MENTAL STATUS:  Appropriate. BACK:  Non-tender to palpation.  No CVAT SKIN:  Warm, dry and intact.   GU: Penis:  uncircumcised; phimosis, unable to retract foreskin Meatus: Normal Scrotum: normal, no masses Testis: normal without masses bilateral  Results: None

## 2021-12-02 DIAGNOSIS — E559 Vitamin D deficiency, unspecified: Secondary | ICD-10-CM | POA: Diagnosis not present

## 2021-12-02 DIAGNOSIS — R5383 Other fatigue: Secondary | ICD-10-CM | POA: Diagnosis not present

## 2021-12-02 DIAGNOSIS — Z1329 Encounter for screening for other suspected endocrine disorder: Secondary | ICD-10-CM | POA: Diagnosis not present

## 2021-12-02 DIAGNOSIS — G250 Essential tremor: Secondary | ICD-10-CM | POA: Diagnosis not present

## 2021-12-02 DIAGNOSIS — D519 Vitamin B12 deficiency anemia, unspecified: Secondary | ICD-10-CM | POA: Diagnosis not present

## 2021-12-02 DIAGNOSIS — E7849 Other hyperlipidemia: Secondary | ICD-10-CM | POA: Diagnosis not present

## 2021-12-09 DIAGNOSIS — G629 Polyneuropathy, unspecified: Secondary | ICD-10-CM | POA: Diagnosis not present

## 2021-12-09 DIAGNOSIS — F321 Major depressive disorder, single episode, moderate: Secondary | ICD-10-CM | POA: Diagnosis not present

## 2021-12-09 DIAGNOSIS — N471 Phimosis: Secondary | ICD-10-CM | POA: Diagnosis not present

## 2021-12-09 DIAGNOSIS — G20C Parkinsonism, unspecified: Secondary | ICD-10-CM | POA: Diagnosis not present

## 2021-12-09 DIAGNOSIS — R030 Elevated blood-pressure reading, without diagnosis of hypertension: Secondary | ICD-10-CM | POA: Diagnosis not present

## 2021-12-09 DIAGNOSIS — Z6822 Body mass index (BMI) 22.0-22.9, adult: Secondary | ICD-10-CM | POA: Diagnosis not present

## 2021-12-09 DIAGNOSIS — G319 Degenerative disease of nervous system, unspecified: Secondary | ICD-10-CM | POA: Diagnosis not present

## 2022-01-20 DIAGNOSIS — M4316 Spondylolisthesis, lumbar region: Secondary | ICD-10-CM | POA: Diagnosis not present

## 2022-01-20 DIAGNOSIS — R030 Elevated blood-pressure reading, without diagnosis of hypertension: Secondary | ICD-10-CM | POA: Diagnosis not present

## 2022-01-20 DIAGNOSIS — R296 Repeated falls: Secondary | ICD-10-CM | POA: Diagnosis not present

## 2022-01-20 DIAGNOSIS — G20C Parkinsonism, unspecified: Secondary | ICD-10-CM | POA: Diagnosis not present

## 2022-01-20 DIAGNOSIS — M5136 Other intervertebral disc degeneration, lumbar region: Secondary | ICD-10-CM | POA: Diagnosis not present

## 2022-01-20 DIAGNOSIS — Z6822 Body mass index (BMI) 22.0-22.9, adult: Secondary | ICD-10-CM | POA: Diagnosis not present

## 2022-02-13 DIAGNOSIS — S22089A Unspecified fracture of T11-T12 vertebra, initial encounter for closed fracture: Secondary | ICD-10-CM | POA: Diagnosis not present

## 2022-02-13 DIAGNOSIS — M5127 Other intervertebral disc displacement, lumbosacral region: Secondary | ICD-10-CM | POA: Diagnosis not present

## 2022-02-13 DIAGNOSIS — M5137 Other intervertebral disc degeneration, lumbosacral region: Secondary | ICD-10-CM | POA: Diagnosis not present

## 2022-02-13 DIAGNOSIS — M48061 Spinal stenosis, lumbar region without neurogenic claudication: Secondary | ICD-10-CM | POA: Diagnosis not present

## 2022-02-13 DIAGNOSIS — M5136 Other intervertebral disc degeneration, lumbar region: Secondary | ICD-10-CM | POA: Diagnosis not present

## 2022-02-13 DIAGNOSIS — M47816 Spondylosis without myelopathy or radiculopathy, lumbar region: Secondary | ICD-10-CM | POA: Diagnosis not present

## 2022-02-13 DIAGNOSIS — M4316 Spondylolisthesis, lumbar region: Secondary | ICD-10-CM | POA: Diagnosis not present

## 2022-03-11 DIAGNOSIS — I10 Essential (primary) hypertension: Secondary | ICD-10-CM | POA: Diagnosis not present

## 2022-03-11 DIAGNOSIS — E7849 Other hyperlipidemia: Secondary | ICD-10-CM | POA: Diagnosis not present

## 2022-03-16 DIAGNOSIS — Z0001 Encounter for general adult medical examination with abnormal findings: Secondary | ICD-10-CM | POA: Diagnosis not present

## 2022-03-16 DIAGNOSIS — M4316 Spondylolisthesis, lumbar region: Secondary | ICD-10-CM | POA: Diagnosis not present

## 2022-03-16 DIAGNOSIS — M5136 Other intervertebral disc degeneration, lumbar region: Secondary | ICD-10-CM | POA: Diagnosis not present

## 2022-03-16 DIAGNOSIS — F321 Major depressive disorder, single episode, moderate: Secondary | ICD-10-CM | POA: Diagnosis not present

## 2022-03-16 DIAGNOSIS — R030 Elevated blood-pressure reading, without diagnosis of hypertension: Secondary | ICD-10-CM | POA: Diagnosis not present

## 2022-03-16 DIAGNOSIS — G319 Degenerative disease of nervous system, unspecified: Secondary | ICD-10-CM | POA: Diagnosis not present

## 2022-03-16 DIAGNOSIS — Z6821 Body mass index (BMI) 21.0-21.9, adult: Secondary | ICD-10-CM | POA: Diagnosis not present

## 2022-03-16 DIAGNOSIS — R296 Repeated falls: Secondary | ICD-10-CM | POA: Diagnosis not present

## 2022-03-16 DIAGNOSIS — G20C Parkinsonism, unspecified: Secondary | ICD-10-CM | POA: Diagnosis not present

## 2022-03-16 DIAGNOSIS — G629 Polyneuropathy, unspecified: Secondary | ICD-10-CM | POA: Diagnosis not present

## 2022-03-28 ENCOUNTER — Other Ambulatory Visit: Payer: Self-pay | Admitting: Neurology

## 2022-04-24 ENCOUNTER — Other Ambulatory Visit: Payer: Self-pay | Admitting: Neurology

## 2022-04-28 ENCOUNTER — Other Ambulatory Visit: Payer: Self-pay | Admitting: Neurology

## 2022-06-12 ENCOUNTER — Other Ambulatory Visit: Payer: Self-pay | Admitting: Neurology

## 2022-06-15 DIAGNOSIS — R296 Repeated falls: Secondary | ICD-10-CM | POA: Diagnosis not present

## 2022-06-15 DIAGNOSIS — G20C Parkinsonism, unspecified: Secondary | ICD-10-CM | POA: Diagnosis not present

## 2022-06-15 DIAGNOSIS — Z6821 Body mass index (BMI) 21.0-21.9, adult: Secondary | ICD-10-CM | POA: Diagnosis not present

## 2022-06-15 DIAGNOSIS — G319 Degenerative disease of nervous system, unspecified: Secondary | ICD-10-CM | POA: Diagnosis not present

## 2022-06-15 DIAGNOSIS — R030 Elevated blood-pressure reading, without diagnosis of hypertension: Secondary | ICD-10-CM | POA: Diagnosis not present

## 2022-06-15 DIAGNOSIS — G629 Polyneuropathy, unspecified: Secondary | ICD-10-CM | POA: Diagnosis not present

## 2022-06-15 DIAGNOSIS — M5136 Other intervertebral disc degeneration, lumbar region: Secondary | ICD-10-CM | POA: Diagnosis not present

## 2022-07-24 DIAGNOSIS — M5136 Other intervertebral disc degeneration, lumbar region: Secondary | ICD-10-CM | POA: Diagnosis not present

## 2022-07-24 DIAGNOSIS — G629 Polyneuropathy, unspecified: Secondary | ICD-10-CM | POA: Diagnosis not present

## 2022-07-24 DIAGNOSIS — R030 Elevated blood-pressure reading, without diagnosis of hypertension: Secondary | ICD-10-CM | POA: Diagnosis not present

## 2022-07-24 DIAGNOSIS — G319 Degenerative disease of nervous system, unspecified: Secondary | ICD-10-CM | POA: Diagnosis not present

## 2022-07-24 DIAGNOSIS — R296 Repeated falls: Secondary | ICD-10-CM | POA: Diagnosis not present

## 2022-07-24 DIAGNOSIS — G20C Parkinsonism, unspecified: Secondary | ICD-10-CM | POA: Diagnosis not present

## 2022-07-24 DIAGNOSIS — Z6821 Body mass index (BMI) 21.0-21.9, adult: Secondary | ICD-10-CM | POA: Diagnosis not present

## 2022-08-11 DIAGNOSIS — S0081XA Abrasion of other part of head, initial encounter: Secondary | ICD-10-CM | POA: Diagnosis not present

## 2022-08-11 DIAGNOSIS — S22038A Other fracture of third thoracic vertebra, initial encounter for closed fracture: Secondary | ICD-10-CM | POA: Diagnosis not present

## 2022-08-11 DIAGNOSIS — S299XXA Unspecified injury of thorax, initial encounter: Secondary | ICD-10-CM | POA: Diagnosis not present

## 2022-08-11 DIAGNOSIS — S22088A Other fracture of T11-T12 vertebra, initial encounter for closed fracture: Secondary | ICD-10-CM | POA: Diagnosis not present

## 2022-08-11 DIAGNOSIS — M160 Bilateral primary osteoarthritis of hip: Secondary | ICD-10-CM | POA: Diagnosis not present

## 2022-08-11 DIAGNOSIS — S3993XA Unspecified injury of pelvis, initial encounter: Secondary | ICD-10-CM | POA: Diagnosis not present

## 2022-08-11 DIAGNOSIS — S7012XA Contusion of left thigh, initial encounter: Secondary | ICD-10-CM | POA: Diagnosis not present

## 2022-08-11 DIAGNOSIS — M8588 Other specified disorders of bone density and structure, other site: Secondary | ICD-10-CM | POA: Diagnosis not present

## 2022-08-11 DIAGNOSIS — S6981XA Other specified injuries of right wrist, hand and finger(s), initial encounter: Secondary | ICD-10-CM | POA: Diagnosis not present

## 2022-08-11 DIAGNOSIS — S199XXA Unspecified injury of neck, initial encounter: Secondary | ICD-10-CM | POA: Diagnosis not present

## 2022-08-11 DIAGNOSIS — M47814 Spondylosis without myelopathy or radiculopathy, thoracic region: Secondary | ICD-10-CM | POA: Diagnosis not present

## 2022-08-11 DIAGNOSIS — M25521 Pain in right elbow: Secondary | ICD-10-CM | POA: Diagnosis not present

## 2022-08-11 DIAGNOSIS — M4854XA Collapsed vertebra, not elsewhere classified, thoracic region, initial encounter for fracture: Secondary | ICD-10-CM | POA: Diagnosis not present

## 2022-08-11 DIAGNOSIS — S22058A Other fracture of T5-T6 vertebra, initial encounter for closed fracture: Secondary | ICD-10-CM | POA: Diagnosis not present

## 2022-08-11 DIAGNOSIS — Y33XXXA Other specified events, undetermined intent, initial encounter: Secondary | ICD-10-CM | POA: Diagnosis not present

## 2022-08-11 DIAGNOSIS — Y92410 Unspecified street and highway as the place of occurrence of the external cause: Secondary | ICD-10-CM | POA: Diagnosis not present

## 2022-08-11 DIAGNOSIS — S0990XA Unspecified injury of head, initial encounter: Secondary | ICD-10-CM | POA: Diagnosis not present

## 2022-08-11 DIAGNOSIS — S50311A Abrasion of right elbow, initial encounter: Secondary | ICD-10-CM | POA: Diagnosis not present

## 2022-08-11 DIAGNOSIS — S22078A Other fracture of T9-T10 vertebra, initial encounter for closed fracture: Secondary | ICD-10-CM | POA: Diagnosis not present

## 2022-08-11 DIAGNOSIS — M4804 Spinal stenosis, thoracic region: Secondary | ICD-10-CM | POA: Diagnosis not present

## 2022-08-11 DIAGNOSIS — S3991XA Unspecified injury of abdomen, initial encounter: Secondary | ICD-10-CM | POA: Diagnosis not present

## 2022-10-20 DIAGNOSIS — F339 Major depressive disorder, recurrent, unspecified: Secondary | ICD-10-CM | POA: Diagnosis not present

## 2022-10-20 DIAGNOSIS — G629 Polyneuropathy, unspecified: Secondary | ICD-10-CM | POA: Diagnosis not present

## 2022-10-20 DIAGNOSIS — G319 Degenerative disease of nervous system, unspecified: Secondary | ICD-10-CM | POA: Diagnosis not present

## 2022-10-20 DIAGNOSIS — R030 Elevated blood-pressure reading, without diagnosis of hypertension: Secondary | ICD-10-CM | POA: Diagnosis not present

## 2022-10-20 DIAGNOSIS — M51369 Other intervertebral disc degeneration, lumbar region without mention of lumbar back pain or lower extremity pain: Secondary | ICD-10-CM | POA: Diagnosis not present

## 2022-10-20 DIAGNOSIS — Z6821 Body mass index (BMI) 21.0-21.9, adult: Secondary | ICD-10-CM | POA: Diagnosis not present

## 2022-10-20 DIAGNOSIS — G20C Parkinsonism, unspecified: Secondary | ICD-10-CM | POA: Diagnosis not present

## 2022-10-20 DIAGNOSIS — Z23 Encounter for immunization: Secondary | ICD-10-CM | POA: Diagnosis not present

## 2022-12-15 DIAGNOSIS — R5381 Other malaise: Secondary | ICD-10-CM | POA: Diagnosis not present

## 2022-12-15 DIAGNOSIS — I6782 Cerebral ischemia: Secondary | ICD-10-CM | POA: Diagnosis not present

## 2022-12-15 DIAGNOSIS — E785 Hyperlipidemia, unspecified: Secondary | ICD-10-CM | POA: Diagnosis not present

## 2022-12-15 DIAGNOSIS — R918 Other nonspecific abnormal finding of lung field: Secondary | ICD-10-CM | POA: Diagnosis not present

## 2022-12-15 DIAGNOSIS — R531 Weakness: Secondary | ICD-10-CM | POA: Diagnosis not present

## 2022-12-15 DIAGNOSIS — K5900 Constipation, unspecified: Secondary | ICD-10-CM | POA: Diagnosis not present

## 2022-12-15 DIAGNOSIS — R0989 Other specified symptoms and signs involving the circulatory and respiratory systems: Secondary | ICD-10-CM | POA: Diagnosis not present

## 2022-12-17 DIAGNOSIS — K5790 Diverticulosis of intestine, part unspecified, without perforation or abscess without bleeding: Secondary | ICD-10-CM | POA: Diagnosis not present

## 2022-12-17 DIAGNOSIS — M19071 Primary osteoarthritis, right ankle and foot: Secondary | ICD-10-CM | POA: Diagnosis not present

## 2022-12-17 DIAGNOSIS — K5900 Constipation, unspecified: Secondary | ICD-10-CM | POA: Diagnosis not present

## 2022-12-17 DIAGNOSIS — F339 Major depressive disorder, recurrent, unspecified: Secondary | ICD-10-CM | POA: Diagnosis not present

## 2022-12-17 DIAGNOSIS — Z9181 History of falling: Secondary | ICD-10-CM | POA: Diagnosis not present

## 2022-12-17 DIAGNOSIS — R160 Hepatomegaly, not elsewhere classified: Secondary | ICD-10-CM | POA: Diagnosis not present

## 2022-12-17 DIAGNOSIS — N400 Enlarged prostate without lower urinary tract symptoms: Secondary | ICD-10-CM | POA: Diagnosis not present

## 2022-12-17 DIAGNOSIS — G4700 Insomnia, unspecified: Secondary | ICD-10-CM | POA: Diagnosis not present

## 2022-12-17 DIAGNOSIS — G319 Degenerative disease of nervous system, unspecified: Secondary | ICD-10-CM | POA: Diagnosis not present

## 2022-12-17 DIAGNOSIS — G629 Polyneuropathy, unspecified: Secondary | ICD-10-CM | POA: Diagnosis not present

## 2022-12-17 DIAGNOSIS — E559 Vitamin D deficiency, unspecified: Secondary | ICD-10-CM | POA: Diagnosis not present

## 2022-12-17 DIAGNOSIS — K529 Noninfective gastroenteritis and colitis, unspecified: Secondary | ICD-10-CM | POA: Diagnosis not present

## 2022-12-17 DIAGNOSIS — D126 Benign neoplasm of colon, unspecified: Secondary | ICD-10-CM | POA: Diagnosis not present

## 2022-12-17 DIAGNOSIS — Z9842 Cataract extraction status, left eye: Secondary | ICD-10-CM | POA: Diagnosis not present

## 2022-12-17 DIAGNOSIS — G20C Parkinsonism, unspecified: Secondary | ICD-10-CM | POA: Diagnosis not present

## 2022-12-17 DIAGNOSIS — M51369 Other intervertebral disc degeneration, lumbar region without mention of lumbar back pain or lower extremity pain: Secondary | ICD-10-CM | POA: Diagnosis not present

## 2022-12-17 DIAGNOSIS — E785 Hyperlipidemia, unspecified: Secondary | ICD-10-CM | POA: Diagnosis not present

## 2022-12-17 DIAGNOSIS — I110 Hypertensive heart disease with heart failure: Secondary | ICD-10-CM | POA: Diagnosis not present

## 2022-12-17 DIAGNOSIS — Z7982 Long term (current) use of aspirin: Secondary | ICD-10-CM | POA: Diagnosis not present

## 2022-12-17 DIAGNOSIS — F419 Anxiety disorder, unspecified: Secondary | ICD-10-CM | POA: Diagnosis not present

## 2022-12-17 DIAGNOSIS — I5032 Chronic diastolic (congestive) heart failure: Secondary | ICD-10-CM | POA: Diagnosis not present

## 2022-12-22 DIAGNOSIS — G20C Parkinsonism, unspecified: Secondary | ICD-10-CM | POA: Diagnosis not present

## 2022-12-22 DIAGNOSIS — K529 Noninfective gastroenteritis and colitis, unspecified: Secondary | ICD-10-CM | POA: Diagnosis not present

## 2022-12-22 DIAGNOSIS — G319 Degenerative disease of nervous system, unspecified: Secondary | ICD-10-CM | POA: Diagnosis not present

## 2022-12-22 DIAGNOSIS — I5032 Chronic diastolic (congestive) heart failure: Secondary | ICD-10-CM | POA: Diagnosis not present

## 2022-12-22 DIAGNOSIS — I110 Hypertensive heart disease with heart failure: Secondary | ICD-10-CM | POA: Diagnosis not present

## 2022-12-22 DIAGNOSIS — M51369 Other intervertebral disc degeneration, lumbar region without mention of lumbar back pain or lower extremity pain: Secondary | ICD-10-CM | POA: Diagnosis not present

## 2022-12-23 DIAGNOSIS — I5032 Chronic diastolic (congestive) heart failure: Secondary | ICD-10-CM | POA: Diagnosis not present

## 2022-12-23 DIAGNOSIS — M51369 Other intervertebral disc degeneration, lumbar region without mention of lumbar back pain or lower extremity pain: Secondary | ICD-10-CM | POA: Diagnosis not present

## 2022-12-23 DIAGNOSIS — I110 Hypertensive heart disease with heart failure: Secondary | ICD-10-CM | POA: Diagnosis not present

## 2022-12-23 DIAGNOSIS — K529 Noninfective gastroenteritis and colitis, unspecified: Secondary | ICD-10-CM | POA: Diagnosis not present

## 2022-12-23 DIAGNOSIS — G319 Degenerative disease of nervous system, unspecified: Secondary | ICD-10-CM | POA: Diagnosis not present

## 2022-12-23 DIAGNOSIS — G20C Parkinsonism, unspecified: Secondary | ICD-10-CM | POA: Diagnosis not present

## 2022-12-29 DIAGNOSIS — G20C Parkinsonism, unspecified: Secondary | ICD-10-CM | POA: Diagnosis not present

## 2022-12-29 DIAGNOSIS — K529 Noninfective gastroenteritis and colitis, unspecified: Secondary | ICD-10-CM | POA: Diagnosis not present

## 2022-12-29 DIAGNOSIS — G319 Degenerative disease of nervous system, unspecified: Secondary | ICD-10-CM | POA: Diagnosis not present

## 2022-12-29 DIAGNOSIS — I110 Hypertensive heart disease with heart failure: Secondary | ICD-10-CM | POA: Diagnosis not present

## 2022-12-29 DIAGNOSIS — M51369 Other intervertebral disc degeneration, lumbar region without mention of lumbar back pain or lower extremity pain: Secondary | ICD-10-CM | POA: Diagnosis not present

## 2022-12-29 DIAGNOSIS — I5032 Chronic diastolic (congestive) heart failure: Secondary | ICD-10-CM | POA: Diagnosis not present

## 2023-01-01 DIAGNOSIS — M51369 Other intervertebral disc degeneration, lumbar region without mention of lumbar back pain or lower extremity pain: Secondary | ICD-10-CM | POA: Diagnosis not present

## 2023-01-01 DIAGNOSIS — G20C Parkinsonism, unspecified: Secondary | ICD-10-CM | POA: Diagnosis not present

## 2023-01-01 DIAGNOSIS — I5032 Chronic diastolic (congestive) heart failure: Secondary | ICD-10-CM | POA: Diagnosis not present

## 2023-01-01 DIAGNOSIS — K529 Noninfective gastroenteritis and colitis, unspecified: Secondary | ICD-10-CM | POA: Diagnosis not present

## 2023-01-01 DIAGNOSIS — I110 Hypertensive heart disease with heart failure: Secondary | ICD-10-CM | POA: Diagnosis not present

## 2023-01-01 DIAGNOSIS — G319 Degenerative disease of nervous system, unspecified: Secondary | ICD-10-CM | POA: Diagnosis not present

## 2023-01-05 DIAGNOSIS — G20C Parkinsonism, unspecified: Secondary | ICD-10-CM | POA: Diagnosis not present

## 2023-01-05 DIAGNOSIS — M51369 Other intervertebral disc degeneration, lumbar region without mention of lumbar back pain or lower extremity pain: Secondary | ICD-10-CM | POA: Diagnosis not present

## 2023-01-05 DIAGNOSIS — K529 Noninfective gastroenteritis and colitis, unspecified: Secondary | ICD-10-CM | POA: Diagnosis not present

## 2023-01-05 DIAGNOSIS — I110 Hypertensive heart disease with heart failure: Secondary | ICD-10-CM | POA: Diagnosis not present

## 2023-01-05 DIAGNOSIS — G319 Degenerative disease of nervous system, unspecified: Secondary | ICD-10-CM | POA: Diagnosis not present

## 2023-01-05 DIAGNOSIS — I5032 Chronic diastolic (congestive) heart failure: Secondary | ICD-10-CM | POA: Diagnosis not present

## 2023-01-07 DIAGNOSIS — M51369 Other intervertebral disc degeneration, lumbar region without mention of lumbar back pain or lower extremity pain: Secondary | ICD-10-CM | POA: Diagnosis not present

## 2023-01-07 DIAGNOSIS — K529 Noninfective gastroenteritis and colitis, unspecified: Secondary | ICD-10-CM | POA: Diagnosis not present

## 2023-01-07 DIAGNOSIS — G319 Degenerative disease of nervous system, unspecified: Secondary | ICD-10-CM | POA: Diagnosis not present

## 2023-01-07 DIAGNOSIS — G20C Parkinsonism, unspecified: Secondary | ICD-10-CM | POA: Diagnosis not present

## 2023-01-07 DIAGNOSIS — I110 Hypertensive heart disease with heart failure: Secondary | ICD-10-CM | POA: Diagnosis not present

## 2023-01-07 DIAGNOSIS — I5032 Chronic diastolic (congestive) heart failure: Secondary | ICD-10-CM | POA: Diagnosis not present

## 2023-01-11 DIAGNOSIS — G319 Degenerative disease of nervous system, unspecified: Secondary | ICD-10-CM | POA: Diagnosis not present

## 2023-01-11 DIAGNOSIS — I110 Hypertensive heart disease with heart failure: Secondary | ICD-10-CM | POA: Diagnosis not present

## 2023-01-11 DIAGNOSIS — K529 Noninfective gastroenteritis and colitis, unspecified: Secondary | ICD-10-CM | POA: Diagnosis not present

## 2023-01-11 DIAGNOSIS — G20C Parkinsonism, unspecified: Secondary | ICD-10-CM | POA: Diagnosis not present

## 2023-01-11 DIAGNOSIS — I5032 Chronic diastolic (congestive) heart failure: Secondary | ICD-10-CM | POA: Diagnosis not present

## 2023-01-11 DIAGNOSIS — M51369 Other intervertebral disc degeneration, lumbar region without mention of lumbar back pain or lower extremity pain: Secondary | ICD-10-CM | POA: Diagnosis not present

## 2023-01-12 DIAGNOSIS — G20C Parkinsonism, unspecified: Secondary | ICD-10-CM | POA: Diagnosis not present

## 2023-01-12 DIAGNOSIS — K529 Noninfective gastroenteritis and colitis, unspecified: Secondary | ICD-10-CM | POA: Diagnosis not present

## 2023-01-12 DIAGNOSIS — I5032 Chronic diastolic (congestive) heart failure: Secondary | ICD-10-CM | POA: Diagnosis not present

## 2023-01-12 DIAGNOSIS — I110 Hypertensive heart disease with heart failure: Secondary | ICD-10-CM | POA: Diagnosis not present

## 2023-01-12 DIAGNOSIS — G319 Degenerative disease of nervous system, unspecified: Secondary | ICD-10-CM | POA: Diagnosis not present

## 2023-01-12 DIAGNOSIS — M51369 Other intervertebral disc degeneration, lumbar region without mention of lumbar back pain or lower extremity pain: Secondary | ICD-10-CM | POA: Diagnosis not present

## 2023-01-14 DIAGNOSIS — F02A3 Dementia in other diseases classified elsewhere, mild, with mood disturbance: Secondary | ICD-10-CM | POA: Diagnosis not present

## 2023-01-14 DIAGNOSIS — Z515 Encounter for palliative care: Secondary | ICD-10-CM | POA: Diagnosis not present

## 2023-01-14 DIAGNOSIS — G20C Parkinsonism, unspecified: Secondary | ICD-10-CM | POA: Diagnosis not present

## 2023-01-16 DIAGNOSIS — M19071 Primary osteoarthritis, right ankle and foot: Secondary | ICD-10-CM | POA: Diagnosis not present

## 2023-01-16 DIAGNOSIS — R160 Hepatomegaly, not elsewhere classified: Secondary | ICD-10-CM | POA: Diagnosis not present

## 2023-01-16 DIAGNOSIS — K529 Noninfective gastroenteritis and colitis, unspecified: Secondary | ICD-10-CM | POA: Diagnosis not present

## 2023-01-16 DIAGNOSIS — I110 Hypertensive heart disease with heart failure: Secondary | ICD-10-CM | POA: Diagnosis not present

## 2023-01-16 DIAGNOSIS — Z9842 Cataract extraction status, left eye: Secondary | ICD-10-CM | POA: Diagnosis not present

## 2023-01-16 DIAGNOSIS — E785 Hyperlipidemia, unspecified: Secondary | ICD-10-CM | POA: Diagnosis not present

## 2023-01-16 DIAGNOSIS — N400 Enlarged prostate without lower urinary tract symptoms: Secondary | ICD-10-CM | POA: Diagnosis not present

## 2023-01-16 DIAGNOSIS — Z9181 History of falling: Secondary | ICD-10-CM | POA: Diagnosis not present

## 2023-01-16 DIAGNOSIS — F419 Anxiety disorder, unspecified: Secondary | ICD-10-CM | POA: Diagnosis not present

## 2023-01-16 DIAGNOSIS — K5790 Diverticulosis of intestine, part unspecified, without perforation or abscess without bleeding: Secondary | ICD-10-CM | POA: Diagnosis not present

## 2023-01-16 DIAGNOSIS — G4700 Insomnia, unspecified: Secondary | ICD-10-CM | POA: Diagnosis not present

## 2023-01-16 DIAGNOSIS — E559 Vitamin D deficiency, unspecified: Secondary | ICD-10-CM | POA: Diagnosis not present

## 2023-01-16 DIAGNOSIS — I5032 Chronic diastolic (congestive) heart failure: Secondary | ICD-10-CM | POA: Diagnosis not present

## 2023-01-16 DIAGNOSIS — G629 Polyneuropathy, unspecified: Secondary | ICD-10-CM | POA: Diagnosis not present

## 2023-01-16 DIAGNOSIS — M51369 Other intervertebral disc degeneration, lumbar region without mention of lumbar back pain or lower extremity pain: Secondary | ICD-10-CM | POA: Diagnosis not present

## 2023-01-16 DIAGNOSIS — D126 Benign neoplasm of colon, unspecified: Secondary | ICD-10-CM | POA: Diagnosis not present

## 2023-01-16 DIAGNOSIS — Z7982 Long term (current) use of aspirin: Secondary | ICD-10-CM | POA: Diagnosis not present

## 2023-01-16 DIAGNOSIS — F339 Major depressive disorder, recurrent, unspecified: Secondary | ICD-10-CM | POA: Diagnosis not present

## 2023-01-16 DIAGNOSIS — G319 Degenerative disease of nervous system, unspecified: Secondary | ICD-10-CM | POA: Diagnosis not present

## 2023-01-16 DIAGNOSIS — K5900 Constipation, unspecified: Secondary | ICD-10-CM | POA: Diagnosis not present

## 2023-01-16 DIAGNOSIS — G20C Parkinsonism, unspecified: Secondary | ICD-10-CM | POA: Diagnosis not present

## 2023-01-19 DIAGNOSIS — M51369 Other intervertebral disc degeneration, lumbar region without mention of lumbar back pain or lower extremity pain: Secondary | ICD-10-CM | POA: Diagnosis not present

## 2023-01-19 DIAGNOSIS — D126 Benign neoplasm of colon, unspecified: Secondary | ICD-10-CM | POA: Diagnosis not present

## 2023-01-19 DIAGNOSIS — G319 Degenerative disease of nervous system, unspecified: Secondary | ICD-10-CM | POA: Diagnosis not present

## 2023-01-19 DIAGNOSIS — M19071 Primary osteoarthritis, right ankle and foot: Secondary | ICD-10-CM | POA: Diagnosis not present

## 2023-01-19 DIAGNOSIS — G20C Parkinsonism, unspecified: Secondary | ICD-10-CM | POA: Diagnosis not present

## 2023-01-19 DIAGNOSIS — F339 Major depressive disorder, recurrent, unspecified: Secondary | ICD-10-CM | POA: Diagnosis not present

## 2023-01-19 DIAGNOSIS — I5032 Chronic diastolic (congestive) heart failure: Secondary | ICD-10-CM | POA: Diagnosis not present

## 2023-01-19 DIAGNOSIS — G629 Polyneuropathy, unspecified: Secondary | ICD-10-CM | POA: Diagnosis not present

## 2023-01-19 DIAGNOSIS — I110 Hypertensive heart disease with heart failure: Secondary | ICD-10-CM | POA: Diagnosis not present

## 2023-01-19 DIAGNOSIS — R160 Hepatomegaly, not elsewhere classified: Secondary | ICD-10-CM | POA: Diagnosis not present

## 2023-01-19 DIAGNOSIS — N400 Enlarged prostate without lower urinary tract symptoms: Secondary | ICD-10-CM | POA: Diagnosis not present

## 2023-01-19 DIAGNOSIS — K529 Noninfective gastroenteritis and colitis, unspecified: Secondary | ICD-10-CM | POA: Diagnosis not present

## 2023-01-21 DIAGNOSIS — M51369 Other intervertebral disc degeneration, lumbar region without mention of lumbar back pain or lower extremity pain: Secondary | ICD-10-CM | POA: Diagnosis not present

## 2023-01-21 DIAGNOSIS — G20C Parkinsonism, unspecified: Secondary | ICD-10-CM | POA: Diagnosis not present

## 2023-01-21 DIAGNOSIS — I110 Hypertensive heart disease with heart failure: Secondary | ICD-10-CM | POA: Diagnosis not present

## 2023-01-21 DIAGNOSIS — K529 Noninfective gastroenteritis and colitis, unspecified: Secondary | ICD-10-CM | POA: Diagnosis not present

## 2023-01-21 DIAGNOSIS — I5032 Chronic diastolic (congestive) heart failure: Secondary | ICD-10-CM | POA: Diagnosis not present

## 2023-01-21 DIAGNOSIS — G319 Degenerative disease of nervous system, unspecified: Secondary | ICD-10-CM | POA: Diagnosis not present

## 2023-01-22 DIAGNOSIS — G319 Degenerative disease of nervous system, unspecified: Secondary | ICD-10-CM | POA: Diagnosis not present

## 2023-01-22 DIAGNOSIS — M51369 Other intervertebral disc degeneration, lumbar region without mention of lumbar back pain or lower extremity pain: Secondary | ICD-10-CM | POA: Diagnosis not present

## 2023-01-22 DIAGNOSIS — K529 Noninfective gastroenteritis and colitis, unspecified: Secondary | ICD-10-CM | POA: Diagnosis not present

## 2023-01-22 DIAGNOSIS — I5032 Chronic diastolic (congestive) heart failure: Secondary | ICD-10-CM | POA: Diagnosis not present

## 2023-01-22 DIAGNOSIS — I110 Hypertensive heart disease with heart failure: Secondary | ICD-10-CM | POA: Diagnosis not present

## 2023-01-22 DIAGNOSIS — G20C Parkinsonism, unspecified: Secondary | ICD-10-CM | POA: Diagnosis not present

## 2023-01-26 DIAGNOSIS — I110 Hypertensive heart disease with heart failure: Secondary | ICD-10-CM | POA: Diagnosis not present

## 2023-01-26 DIAGNOSIS — K529 Noninfective gastroenteritis and colitis, unspecified: Secondary | ICD-10-CM | POA: Diagnosis not present

## 2023-01-26 DIAGNOSIS — I5032 Chronic diastolic (congestive) heart failure: Secondary | ICD-10-CM | POA: Diagnosis not present

## 2023-01-26 DIAGNOSIS — G20C Parkinsonism, unspecified: Secondary | ICD-10-CM | POA: Diagnosis not present

## 2023-01-26 DIAGNOSIS — M51369 Other intervertebral disc degeneration, lumbar region without mention of lumbar back pain or lower extremity pain: Secondary | ICD-10-CM | POA: Diagnosis not present

## 2023-01-26 DIAGNOSIS — G319 Degenerative disease of nervous system, unspecified: Secondary | ICD-10-CM | POA: Diagnosis not present

## 2023-01-27 DIAGNOSIS — M51369 Other intervertebral disc degeneration, lumbar region without mention of lumbar back pain or lower extremity pain: Secondary | ICD-10-CM | POA: Diagnosis not present

## 2023-01-27 DIAGNOSIS — G20C Parkinsonism, unspecified: Secondary | ICD-10-CM | POA: Diagnosis not present

## 2023-01-27 DIAGNOSIS — I110 Hypertensive heart disease with heart failure: Secondary | ICD-10-CM | POA: Diagnosis not present

## 2023-01-27 DIAGNOSIS — K529 Noninfective gastroenteritis and colitis, unspecified: Secondary | ICD-10-CM | POA: Diagnosis not present

## 2023-01-27 DIAGNOSIS — G319 Degenerative disease of nervous system, unspecified: Secondary | ICD-10-CM | POA: Diagnosis not present

## 2023-01-27 DIAGNOSIS — I5032 Chronic diastolic (congestive) heart failure: Secondary | ICD-10-CM | POA: Diagnosis not present

## 2023-02-04 DIAGNOSIS — K529 Noninfective gastroenteritis and colitis, unspecified: Secondary | ICD-10-CM | POA: Diagnosis not present

## 2023-02-04 DIAGNOSIS — I5032 Chronic diastolic (congestive) heart failure: Secondary | ICD-10-CM | POA: Diagnosis not present

## 2023-02-04 DIAGNOSIS — I110 Hypertensive heart disease with heart failure: Secondary | ICD-10-CM | POA: Diagnosis not present

## 2023-02-04 DIAGNOSIS — G319 Degenerative disease of nervous system, unspecified: Secondary | ICD-10-CM | POA: Diagnosis not present

## 2023-02-04 DIAGNOSIS — G20C Parkinsonism, unspecified: Secondary | ICD-10-CM | POA: Diagnosis not present

## 2023-02-04 DIAGNOSIS — M51369 Other intervertebral disc degeneration, lumbar region without mention of lumbar back pain or lower extremity pain: Secondary | ICD-10-CM | POA: Diagnosis not present

## 2023-02-08 DIAGNOSIS — R32 Unspecified urinary incontinence: Secondary | ICD-10-CM | POA: Diagnosis not present

## 2023-02-08 DIAGNOSIS — G629 Polyneuropathy, unspecified: Secondary | ICD-10-CM | POA: Diagnosis not present

## 2023-02-08 DIAGNOSIS — Z6820 Body mass index (BMI) 20.0-20.9, adult: Secondary | ICD-10-CM | POA: Diagnosis not present

## 2023-02-08 DIAGNOSIS — I10 Essential (primary) hypertension: Secondary | ICD-10-CM | POA: Diagnosis not present

## 2023-02-08 DIAGNOSIS — G319 Degenerative disease of nervous system, unspecified: Secondary | ICD-10-CM | POA: Diagnosis not present

## 2023-02-09 DIAGNOSIS — M51369 Other intervertebral disc degeneration, lumbar region without mention of lumbar back pain or lower extremity pain: Secondary | ICD-10-CM | POA: Diagnosis not present

## 2023-02-09 DIAGNOSIS — G20C Parkinsonism, unspecified: Secondary | ICD-10-CM | POA: Diagnosis not present

## 2023-02-09 DIAGNOSIS — G319 Degenerative disease of nervous system, unspecified: Secondary | ICD-10-CM | POA: Diagnosis not present

## 2023-02-09 DIAGNOSIS — K529 Noninfective gastroenteritis and colitis, unspecified: Secondary | ICD-10-CM | POA: Diagnosis not present

## 2023-02-09 DIAGNOSIS — I5032 Chronic diastolic (congestive) heart failure: Secondary | ICD-10-CM | POA: Diagnosis not present

## 2023-02-09 DIAGNOSIS — I110 Hypertensive heart disease with heart failure: Secondary | ICD-10-CM | POA: Diagnosis not present

## 2023-02-12 DIAGNOSIS — G20C Parkinsonism, unspecified: Secondary | ICD-10-CM | POA: Diagnosis not present

## 2023-02-12 DIAGNOSIS — I110 Hypertensive heart disease with heart failure: Secondary | ICD-10-CM | POA: Diagnosis not present

## 2023-02-12 DIAGNOSIS — G319 Degenerative disease of nervous system, unspecified: Secondary | ICD-10-CM | POA: Diagnosis not present

## 2023-02-12 DIAGNOSIS — M51369 Other intervertebral disc degeneration, lumbar region without mention of lumbar back pain or lower extremity pain: Secondary | ICD-10-CM | POA: Diagnosis not present

## 2023-02-12 DIAGNOSIS — I5032 Chronic diastolic (congestive) heart failure: Secondary | ICD-10-CM | POA: Diagnosis not present

## 2023-02-12 DIAGNOSIS — K529 Noninfective gastroenteritis and colitis, unspecified: Secondary | ICD-10-CM | POA: Diagnosis not present

## 2023-02-24 IMAGING — CR DG HAND COMPLETE 3+V*L*
3 series · 3 of 3 positions shown · non-contrast
Comparison: None

CLINICAL DATA: Injury.  Increased weakness for 3 months.

EXAM:
LEFT HAND - COMPLETE 3+ VIEW

[x hand pa left]
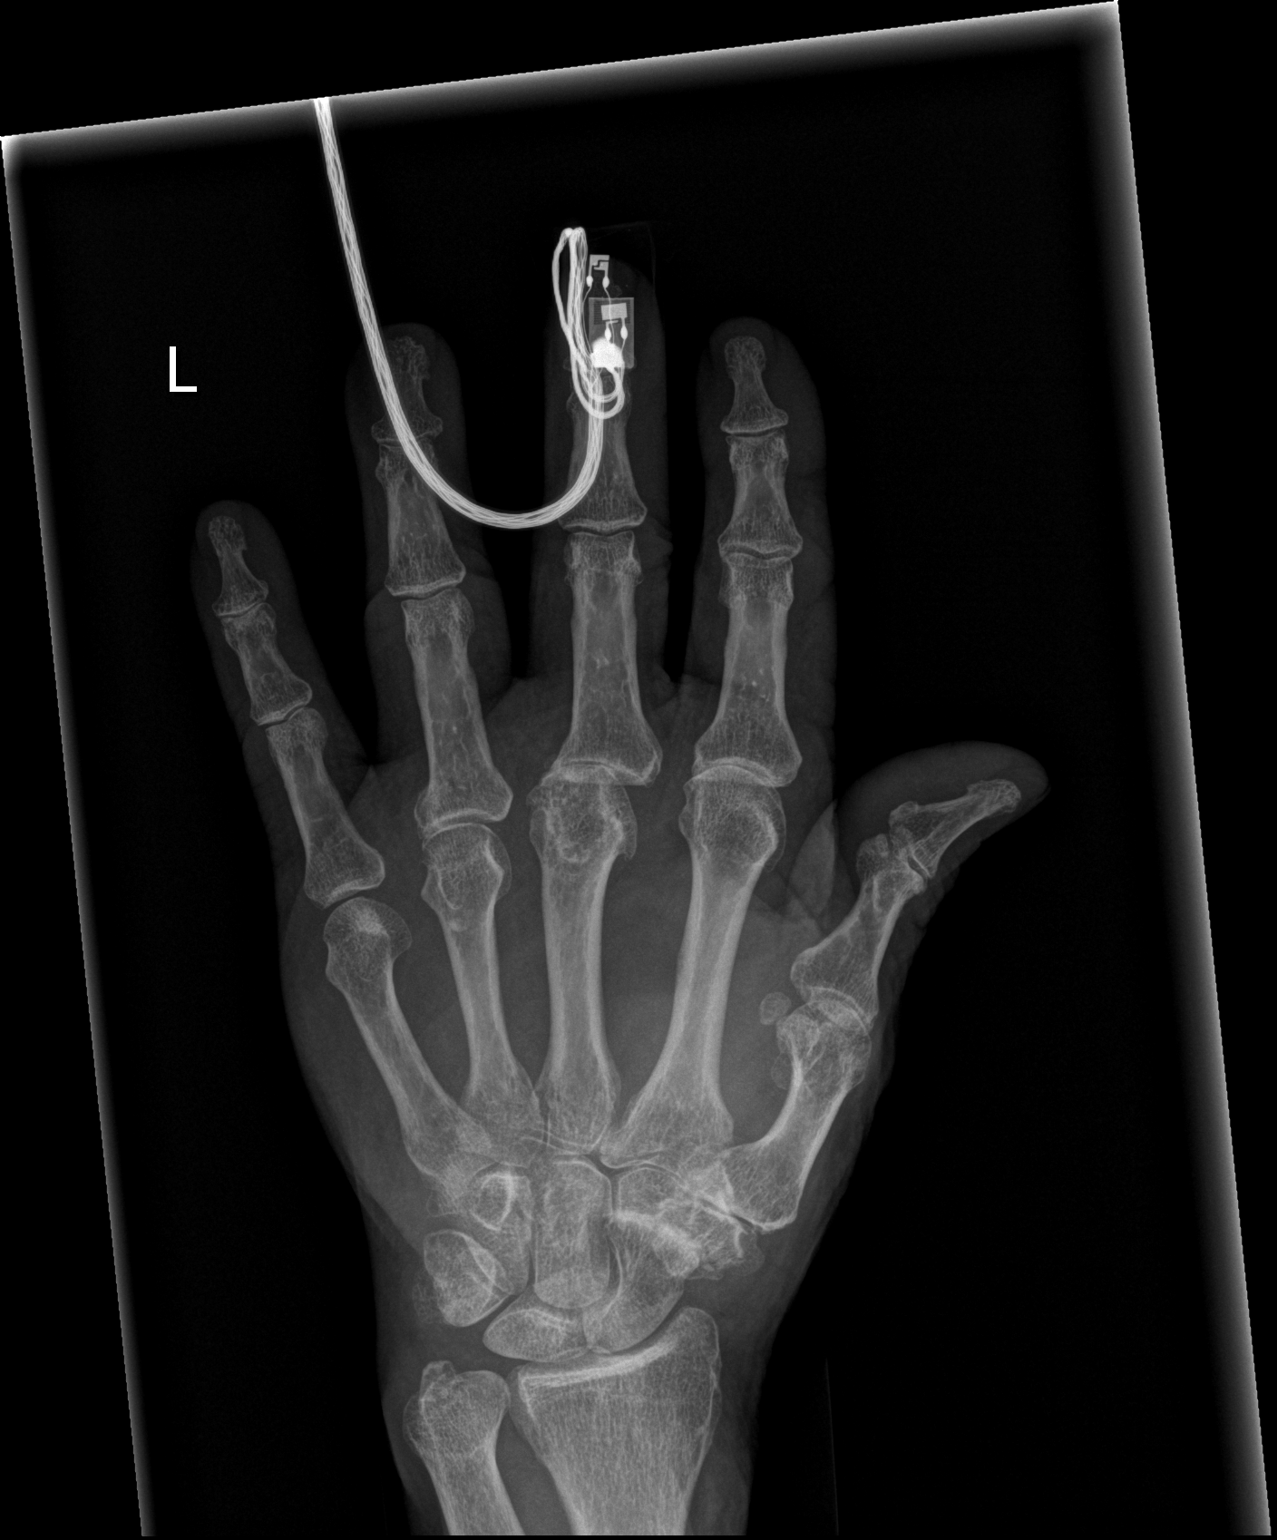

[x hand obl left]
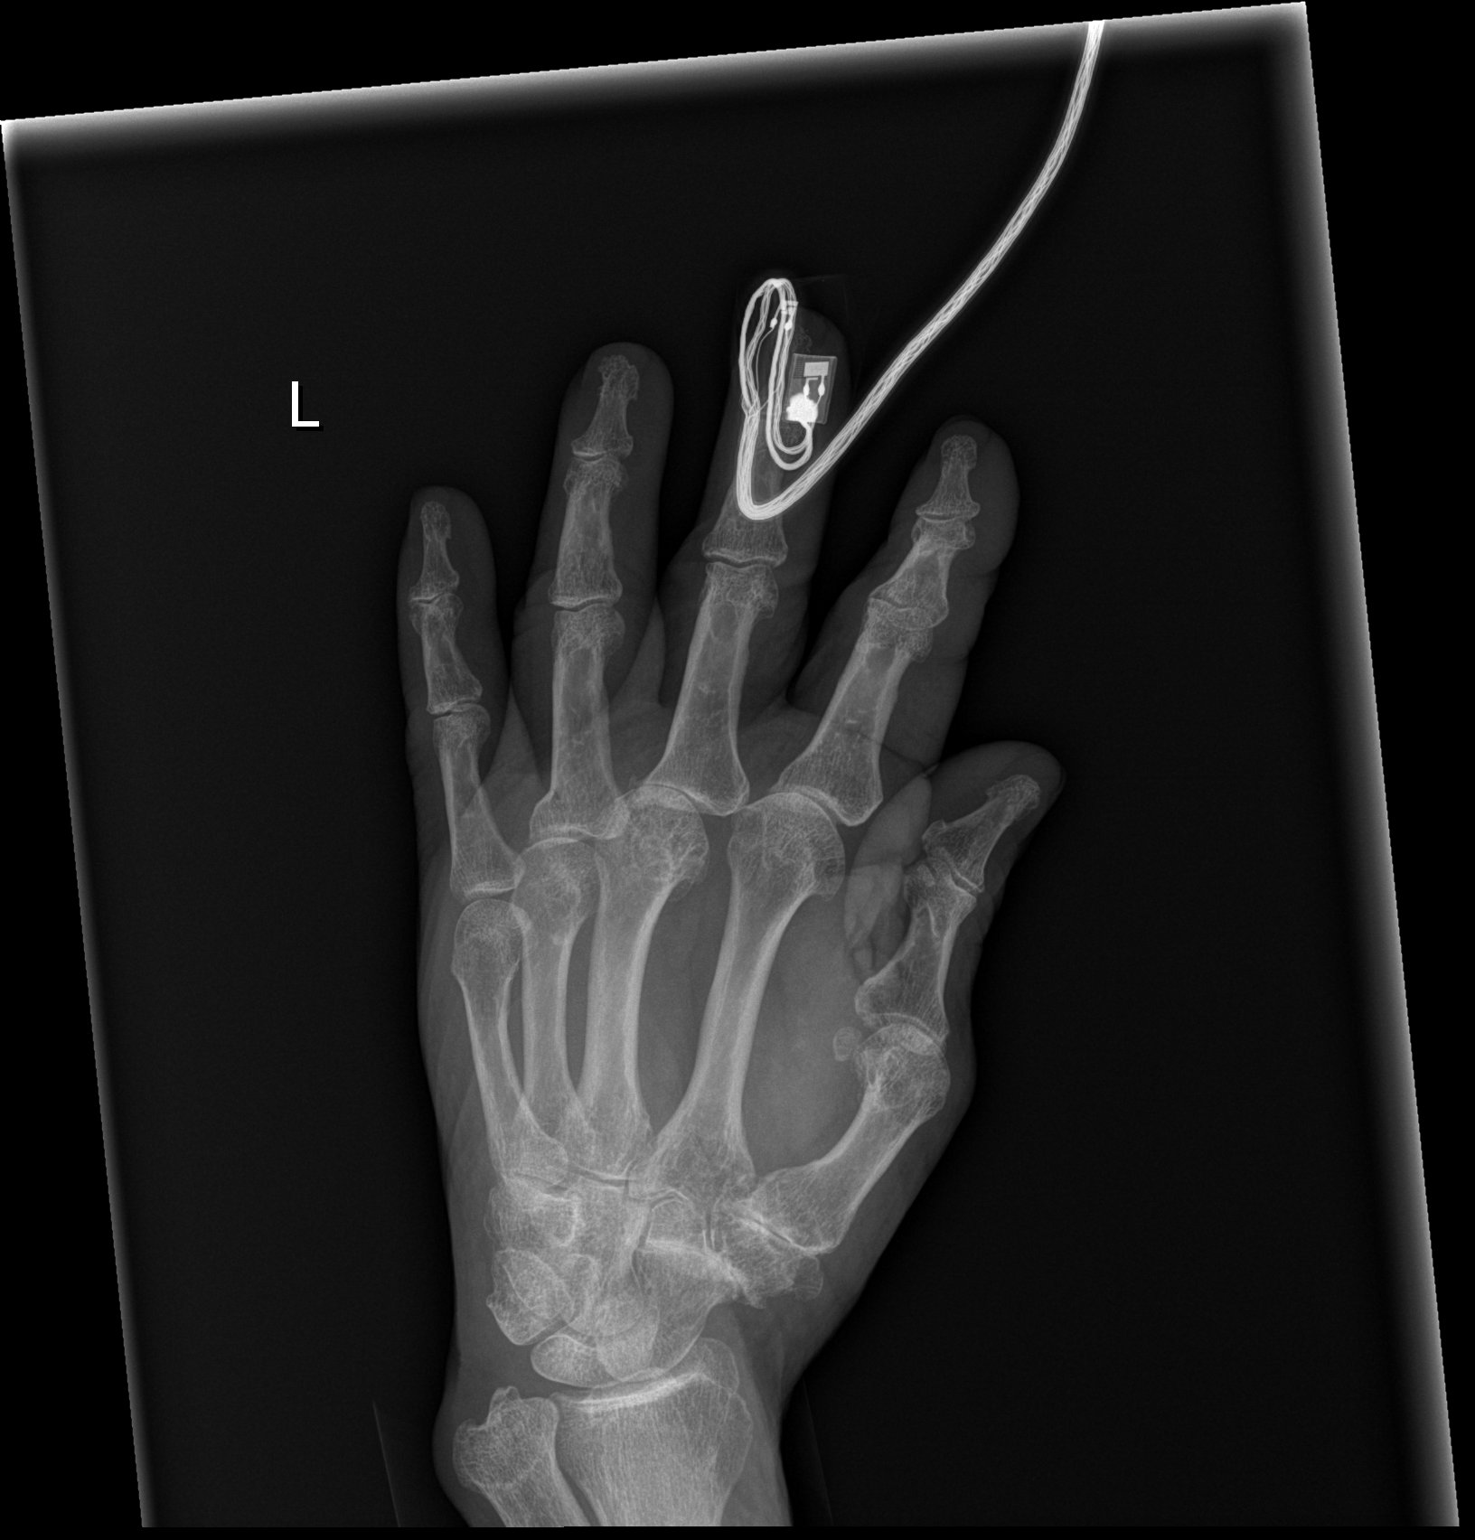

[x hand lat left]
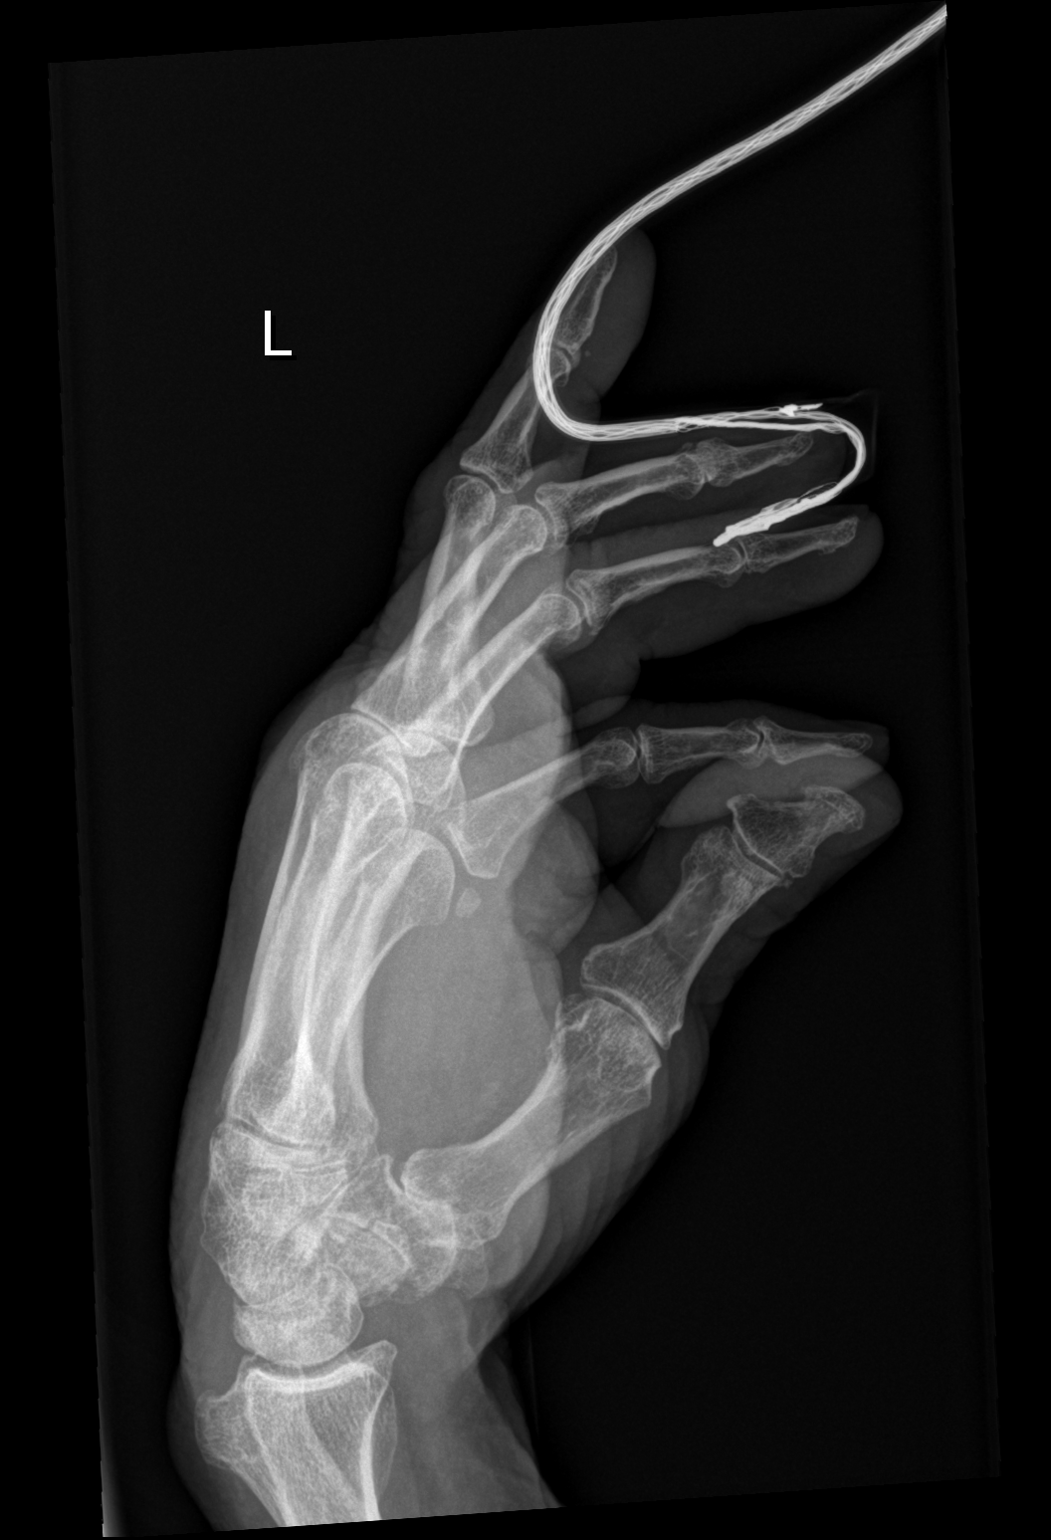

[3 of 3 positions shown; findings below may reference images not displayed]

FINDINGS: No acute fracture or dislocation. Bones appear osteopenic. Scattered
areas of degenerative change noted involving the basilar joint, DIP
joints and second and third MCP joints. Soft tissues are
unremarkable.
IMPRESSION: 1. No acute findings.
2. Osteoarthritis.

## 2023-02-24 IMAGING — CR DG CHEST 2V
2 series · 2 of 2 positions shown · non-contrast
Comparison: None.

CLINICAL DATA: Increased weakness.

EXAM:
CHEST - 2 VIEW

[x chest ap]
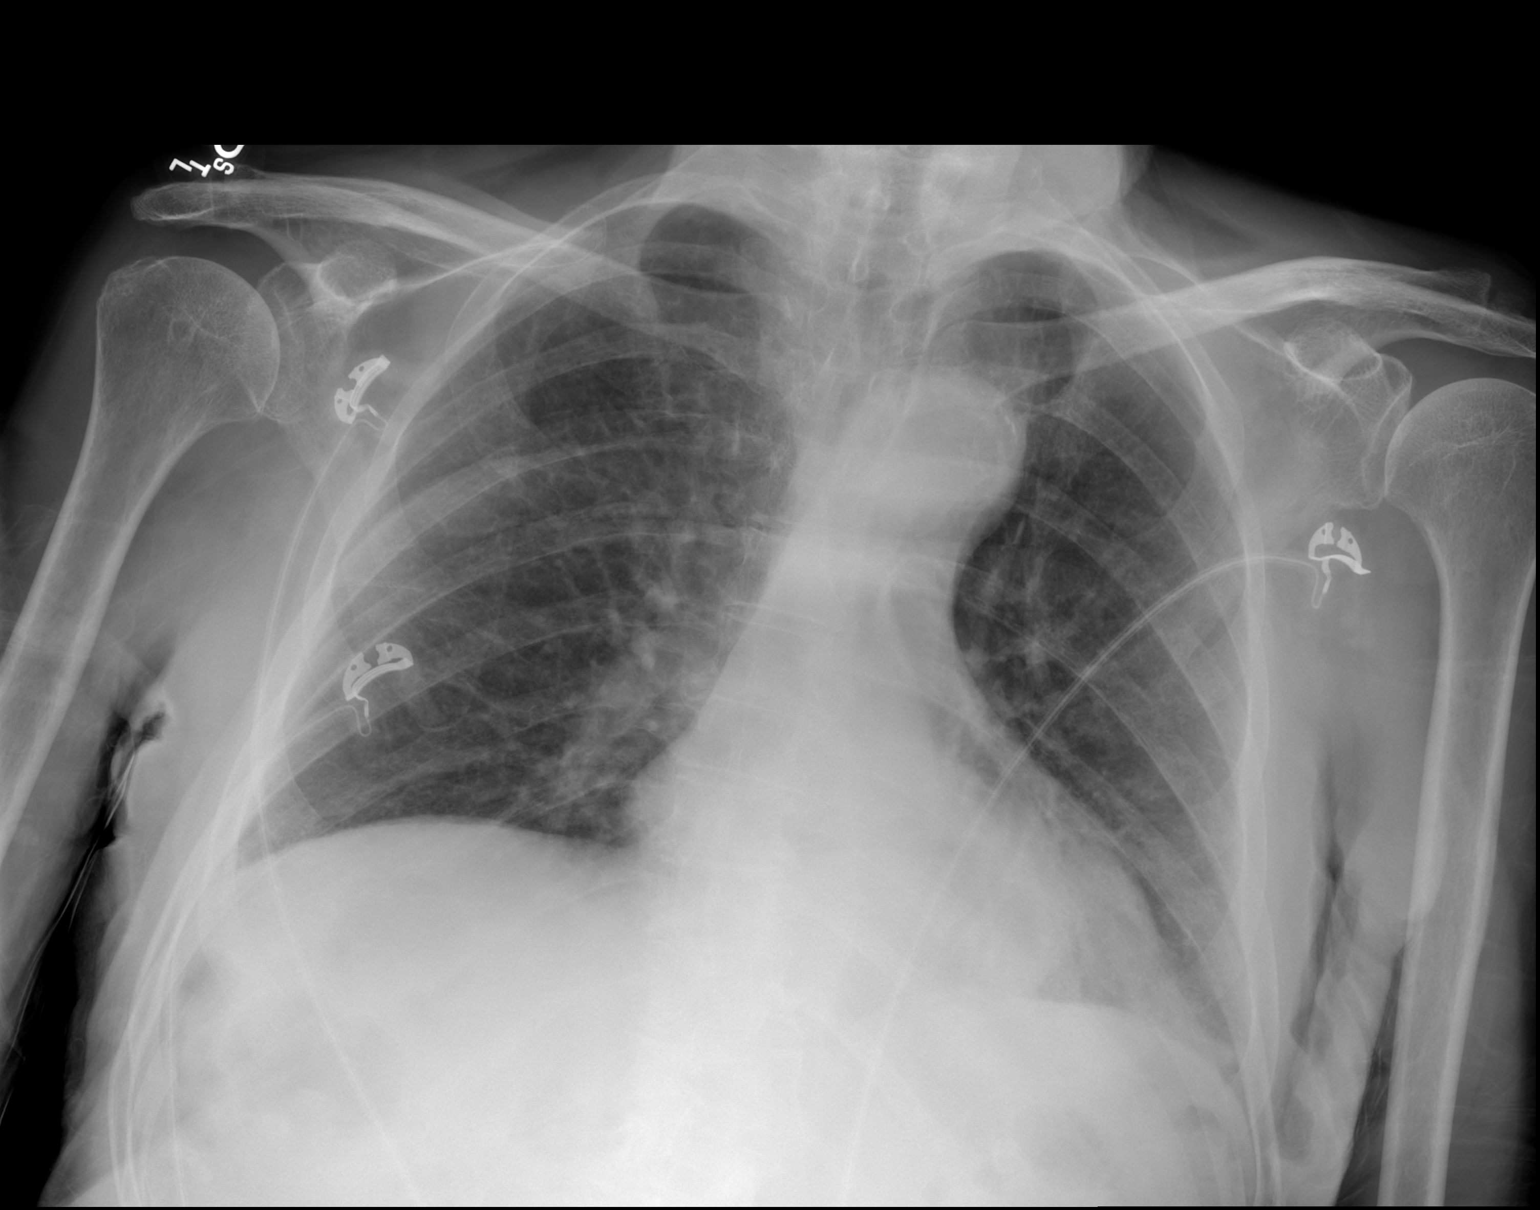

[w chest lat]
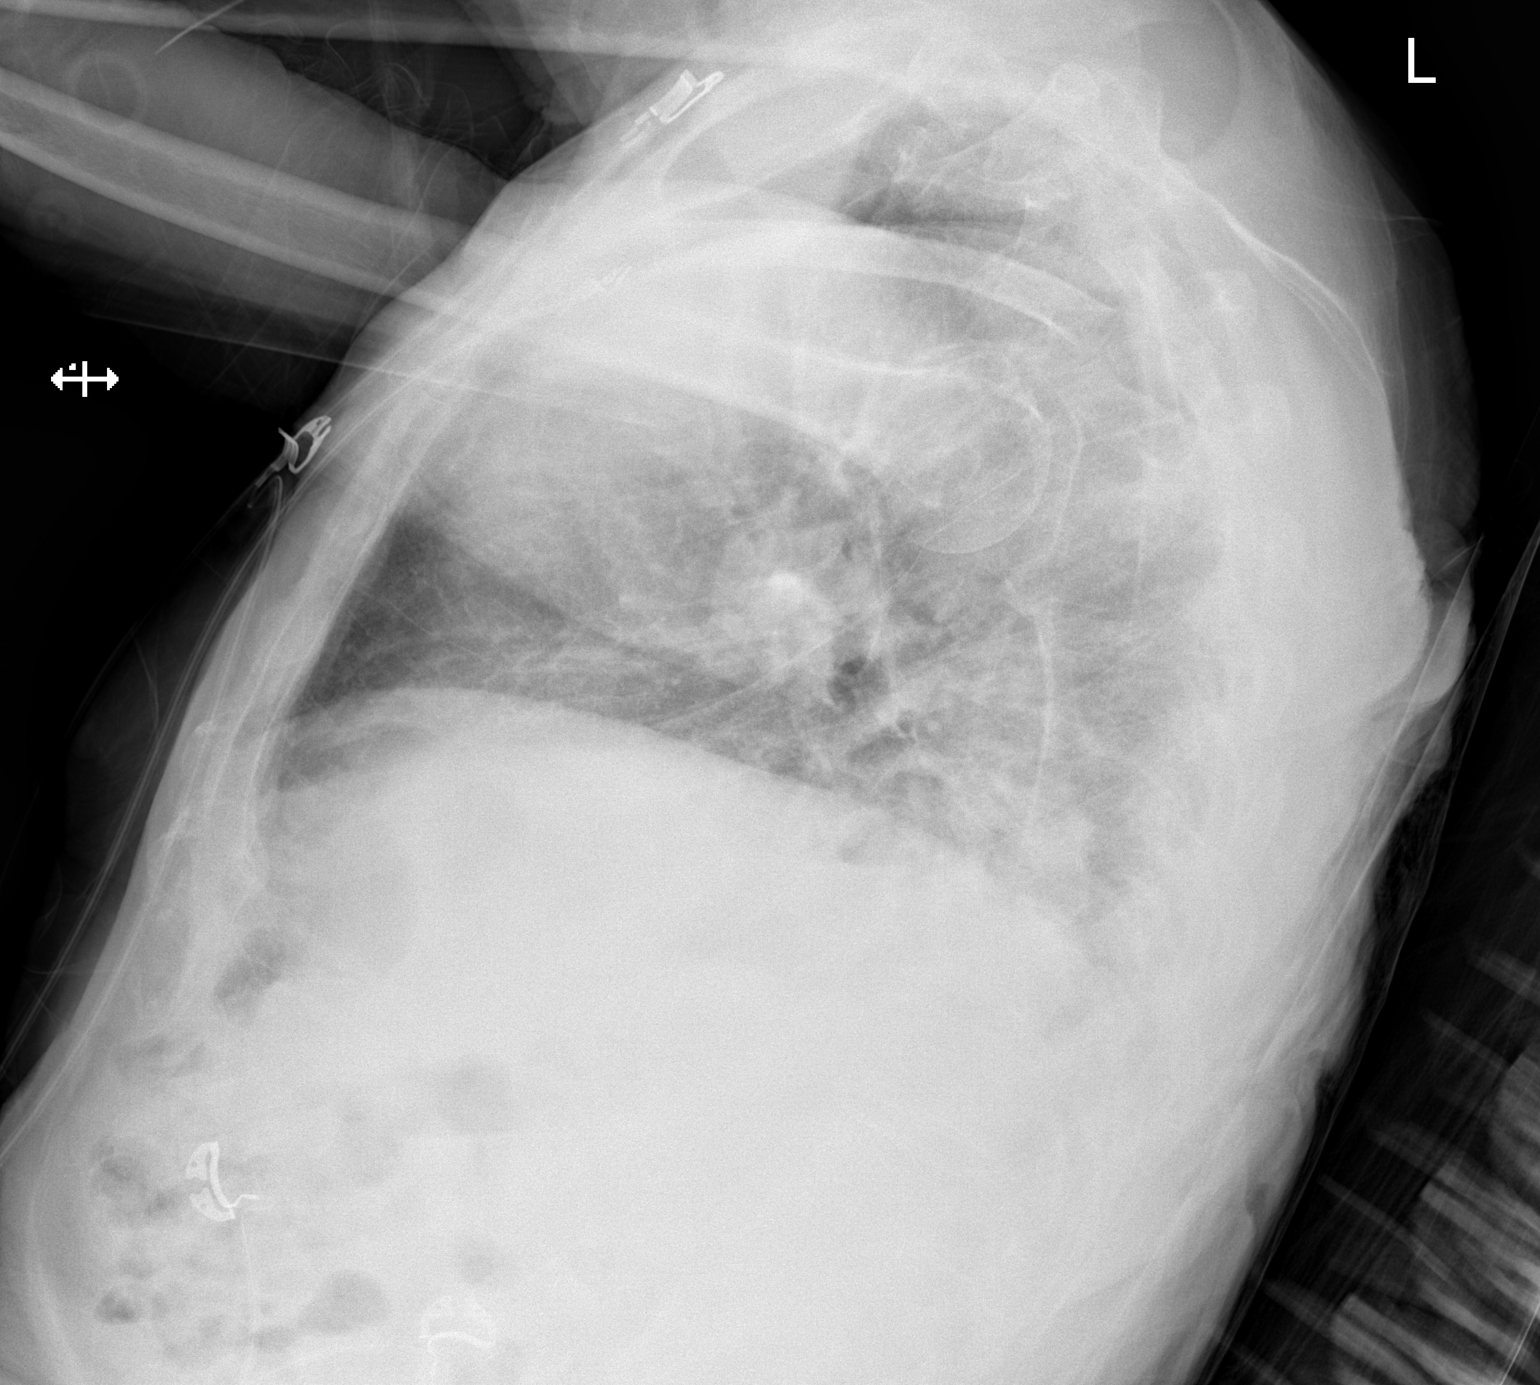

[2 of 2 positions shown; findings below may reference images not displayed]

FINDINGS: The heart is normal in size. Atherosclerotic calcification of aortic
arch. Low lung volumes. Retrocardiac density, which may represent
atelectasis or infiltrate. No acute osseous abnormality.
IMPRESSION: Retrocardiac density, which may represent atelectasis or infiltrate.

## 2023-04-12 DIAGNOSIS — Z9189 Other specified personal risk factors, not elsewhere classified: Secondary | ICD-10-CM | POA: Diagnosis not present

## 2023-04-12 DIAGNOSIS — M48061 Spinal stenosis, lumbar region without neurogenic claudication: Secondary | ICD-10-CM | POA: Diagnosis not present

## 2023-04-12 DIAGNOSIS — Z1389 Encounter for screening for other disorder: Secondary | ICD-10-CM | POA: Diagnosis not present

## 2023-04-12 DIAGNOSIS — Z1331 Encounter for screening for depression: Secondary | ICD-10-CM | POA: Diagnosis not present

## 2023-04-12 DIAGNOSIS — E782 Mixed hyperlipidemia: Secondary | ICD-10-CM | POA: Diagnosis not present

## 2023-04-12 DIAGNOSIS — Z0001 Encounter for general adult medical examination with abnormal findings: Secondary | ICD-10-CM | POA: Diagnosis not present

## 2023-04-12 DIAGNOSIS — Z6820 Body mass index (BMI) 20.0-20.9, adult: Secondary | ICD-10-CM | POA: Diagnosis not present

## 2023-04-12 DIAGNOSIS — F339 Major depressive disorder, recurrent, unspecified: Secondary | ICD-10-CM | POA: Diagnosis not present

## 2023-04-12 DIAGNOSIS — R296 Repeated falls: Secondary | ICD-10-CM | POA: Diagnosis not present

## 2023-08-12 DIAGNOSIS — E782 Mixed hyperlipidemia: Secondary | ICD-10-CM | POA: Diagnosis not present

## 2023-08-12 DIAGNOSIS — R296 Repeated falls: Secondary | ICD-10-CM | POA: Diagnosis not present

## 2023-08-12 DIAGNOSIS — M48061 Spinal stenosis, lumbar region without neurogenic claudication: Secondary | ICD-10-CM | POA: Diagnosis not present

## 2023-08-12 DIAGNOSIS — Z6821 Body mass index (BMI) 21.0-21.9, adult: Secondary | ICD-10-CM | POA: Diagnosis not present

## 2023-08-12 DIAGNOSIS — F339 Major depressive disorder, recurrent, unspecified: Secondary | ICD-10-CM | POA: Diagnosis not present

## 2023-08-16 DIAGNOSIS — G319 Degenerative disease of nervous system, unspecified: Secondary | ICD-10-CM | POA: Diagnosis not present

## 2023-08-16 DIAGNOSIS — F02818 Dementia in other diseases classified elsewhere, unspecified severity, with other behavioral disturbance: Secondary | ICD-10-CM | POA: Diagnosis not present

## 2023-08-16 DIAGNOSIS — F339 Major depressive disorder, recurrent, unspecified: Secondary | ICD-10-CM | POA: Diagnosis not present

## 2023-08-16 DIAGNOSIS — G629 Polyneuropathy, unspecified: Secondary | ICD-10-CM | POA: Diagnosis not present

## 2023-08-16 DIAGNOSIS — G2581 Restless legs syndrome: Secondary | ICD-10-CM | POA: Diagnosis not present

## 2023-08-16 DIAGNOSIS — F0284 Dementia in other diseases classified elsewhere, unspecified severity, with anxiety: Secondary | ICD-10-CM | POA: Diagnosis not present

## 2023-08-16 DIAGNOSIS — Z8781 Personal history of (healed) traumatic fracture: Secondary | ICD-10-CM | POA: Diagnosis not present

## 2023-08-16 DIAGNOSIS — K5790 Diverticulosis of intestine, part unspecified, without perforation or abscess without bleeding: Secondary | ICD-10-CM | POA: Diagnosis not present

## 2023-08-16 DIAGNOSIS — I110 Hypertensive heart disease with heart failure: Secondary | ICD-10-CM | POA: Diagnosis not present

## 2023-08-16 DIAGNOSIS — R296 Repeated falls: Secondary | ICD-10-CM | POA: Diagnosis not present

## 2023-08-16 DIAGNOSIS — G4700 Insomnia, unspecified: Secondary | ICD-10-CM | POA: Diagnosis not present

## 2023-08-16 DIAGNOSIS — F419 Anxiety disorder, unspecified: Secondary | ICD-10-CM | POA: Diagnosis not present

## 2023-08-16 DIAGNOSIS — M51369 Other intervertebral disc degeneration, lumbar region without mention of lumbar back pain or lower extremity pain: Secondary | ICD-10-CM | POA: Diagnosis not present

## 2023-08-16 DIAGNOSIS — E559 Vitamin D deficiency, unspecified: Secondary | ICD-10-CM | POA: Diagnosis not present

## 2023-08-16 DIAGNOSIS — E782 Mixed hyperlipidemia: Secondary | ICD-10-CM | POA: Diagnosis not present

## 2023-08-16 DIAGNOSIS — Z9181 History of falling: Secondary | ICD-10-CM | POA: Diagnosis not present

## 2023-08-16 DIAGNOSIS — G20C Parkinsonism, unspecified: Secondary | ICD-10-CM | POA: Diagnosis not present

## 2023-08-16 DIAGNOSIS — M48061 Spinal stenosis, lumbar region without neurogenic claudication: Secondary | ICD-10-CM | POA: Diagnosis not present

## 2023-08-16 DIAGNOSIS — I5032 Chronic diastolic (congestive) heart failure: Secondary | ICD-10-CM | POA: Diagnosis not present

## 2023-08-19 DIAGNOSIS — G20C Parkinsonism, unspecified: Secondary | ICD-10-CM | POA: Diagnosis not present

## 2023-08-19 DIAGNOSIS — F0284 Dementia in other diseases classified elsewhere, unspecified severity, with anxiety: Secondary | ICD-10-CM | POA: Diagnosis not present

## 2023-08-19 DIAGNOSIS — G319 Degenerative disease of nervous system, unspecified: Secondary | ICD-10-CM | POA: Diagnosis not present

## 2023-08-19 DIAGNOSIS — M48061 Spinal stenosis, lumbar region without neurogenic claudication: Secondary | ICD-10-CM | POA: Diagnosis not present

## 2023-08-19 DIAGNOSIS — M51369 Other intervertebral disc degeneration, lumbar region without mention of lumbar back pain or lower extremity pain: Secondary | ICD-10-CM | POA: Diagnosis not present

## 2023-08-19 DIAGNOSIS — F02818 Dementia in other diseases classified elsewhere, unspecified severity, with other behavioral disturbance: Secondary | ICD-10-CM | POA: Diagnosis not present

## 2023-08-25 DIAGNOSIS — G319 Degenerative disease of nervous system, unspecified: Secondary | ICD-10-CM | POA: Diagnosis not present

## 2023-08-25 DIAGNOSIS — G20C Parkinsonism, unspecified: Secondary | ICD-10-CM | POA: Diagnosis not present

## 2023-08-25 DIAGNOSIS — F02818 Dementia in other diseases classified elsewhere, unspecified severity, with other behavioral disturbance: Secondary | ICD-10-CM | POA: Diagnosis not present

## 2023-08-25 DIAGNOSIS — F0284 Dementia in other diseases classified elsewhere, unspecified severity, with anxiety: Secondary | ICD-10-CM | POA: Diagnosis not present

## 2023-08-25 DIAGNOSIS — M48061 Spinal stenosis, lumbar region without neurogenic claudication: Secondary | ICD-10-CM | POA: Diagnosis not present

## 2023-08-25 DIAGNOSIS — M51369 Other intervertebral disc degeneration, lumbar region without mention of lumbar back pain or lower extremity pain: Secondary | ICD-10-CM | POA: Diagnosis not present

## 2023-08-26 DIAGNOSIS — M48061 Spinal stenosis, lumbar region without neurogenic claudication: Secondary | ICD-10-CM | POA: Diagnosis not present

## 2023-08-26 DIAGNOSIS — F02818 Dementia in other diseases classified elsewhere, unspecified severity, with other behavioral disturbance: Secondary | ICD-10-CM | POA: Diagnosis not present

## 2023-08-26 DIAGNOSIS — G20C Parkinsonism, unspecified: Secondary | ICD-10-CM | POA: Diagnosis not present

## 2023-08-26 DIAGNOSIS — F0284 Dementia in other diseases classified elsewhere, unspecified severity, with anxiety: Secondary | ICD-10-CM | POA: Diagnosis not present

## 2023-08-26 DIAGNOSIS — G319 Degenerative disease of nervous system, unspecified: Secondary | ICD-10-CM | POA: Diagnosis not present

## 2023-08-26 DIAGNOSIS — M51369 Other intervertebral disc degeneration, lumbar region without mention of lumbar back pain or lower extremity pain: Secondary | ICD-10-CM | POA: Diagnosis not present

## 2023-09-02 DIAGNOSIS — F0284 Dementia in other diseases classified elsewhere, unspecified severity, with anxiety: Secondary | ICD-10-CM | POA: Diagnosis not present

## 2023-09-02 DIAGNOSIS — M48061 Spinal stenosis, lumbar region without neurogenic claudication: Secondary | ICD-10-CM | POA: Diagnosis not present

## 2023-09-02 DIAGNOSIS — G319 Degenerative disease of nervous system, unspecified: Secondary | ICD-10-CM | POA: Diagnosis not present

## 2023-09-02 DIAGNOSIS — M51369 Other intervertebral disc degeneration, lumbar region without mention of lumbar back pain or lower extremity pain: Secondary | ICD-10-CM | POA: Diagnosis not present

## 2023-09-02 DIAGNOSIS — G20C Parkinsonism, unspecified: Secondary | ICD-10-CM | POA: Diagnosis not present

## 2023-09-02 DIAGNOSIS — F02818 Dementia in other diseases classified elsewhere, unspecified severity, with other behavioral disturbance: Secondary | ICD-10-CM | POA: Diagnosis not present

## 2023-09-09 DIAGNOSIS — G20C Parkinsonism, unspecified: Secondary | ICD-10-CM | POA: Diagnosis not present

## 2023-09-09 DIAGNOSIS — G319 Degenerative disease of nervous system, unspecified: Secondary | ICD-10-CM | POA: Diagnosis not present

## 2023-09-09 DIAGNOSIS — M48061 Spinal stenosis, lumbar region without neurogenic claudication: Secondary | ICD-10-CM | POA: Diagnosis not present

## 2023-09-09 DIAGNOSIS — M51369 Other intervertebral disc degeneration, lumbar region without mention of lumbar back pain or lower extremity pain: Secondary | ICD-10-CM | POA: Diagnosis not present

## 2023-09-09 DIAGNOSIS — F02818 Dementia in other diseases classified elsewhere, unspecified severity, with other behavioral disturbance: Secondary | ICD-10-CM | POA: Diagnosis not present

## 2023-09-09 DIAGNOSIS — F0284 Dementia in other diseases classified elsewhere, unspecified severity, with anxiety: Secondary | ICD-10-CM | POA: Diagnosis not present

## 2023-09-10 DIAGNOSIS — G20C Parkinsonism, unspecified: Secondary | ICD-10-CM | POA: Diagnosis not present

## 2023-09-10 DIAGNOSIS — F02818 Dementia in other diseases classified elsewhere, unspecified severity, with other behavioral disturbance: Secondary | ICD-10-CM | POA: Diagnosis not present

## 2023-09-10 DIAGNOSIS — M48061 Spinal stenosis, lumbar region without neurogenic claudication: Secondary | ICD-10-CM | POA: Diagnosis not present

## 2023-09-10 DIAGNOSIS — F0284 Dementia in other diseases classified elsewhere, unspecified severity, with anxiety: Secondary | ICD-10-CM | POA: Diagnosis not present

## 2023-09-10 DIAGNOSIS — G319 Degenerative disease of nervous system, unspecified: Secondary | ICD-10-CM | POA: Diagnosis not present

## 2023-09-10 DIAGNOSIS — M51369 Other intervertebral disc degeneration, lumbar region without mention of lumbar back pain or lower extremity pain: Secondary | ICD-10-CM | POA: Diagnosis not present

## 2023-09-14 DIAGNOSIS — M51369 Other intervertebral disc degeneration, lumbar region without mention of lumbar back pain or lower extremity pain: Secondary | ICD-10-CM | POA: Diagnosis not present

## 2023-09-14 DIAGNOSIS — F0284 Dementia in other diseases classified elsewhere, unspecified severity, with anxiety: Secondary | ICD-10-CM | POA: Diagnosis not present

## 2023-09-14 DIAGNOSIS — G20C Parkinsonism, unspecified: Secondary | ICD-10-CM | POA: Diagnosis not present

## 2023-09-14 DIAGNOSIS — M48061 Spinal stenosis, lumbar region without neurogenic claudication: Secondary | ICD-10-CM | POA: Diagnosis not present

## 2023-09-14 DIAGNOSIS — G319 Degenerative disease of nervous system, unspecified: Secondary | ICD-10-CM | POA: Diagnosis not present

## 2023-09-14 DIAGNOSIS — F02818 Dementia in other diseases classified elsewhere, unspecified severity, with other behavioral disturbance: Secondary | ICD-10-CM | POA: Diagnosis not present

## 2023-09-15 DIAGNOSIS — E559 Vitamin D deficiency, unspecified: Secondary | ICD-10-CM | POA: Diagnosis not present

## 2023-09-15 DIAGNOSIS — Z9181 History of falling: Secondary | ICD-10-CM | POA: Diagnosis not present

## 2023-09-15 DIAGNOSIS — G2581 Restless legs syndrome: Secondary | ICD-10-CM | POA: Diagnosis not present

## 2023-09-15 DIAGNOSIS — M51369 Other intervertebral disc degeneration, lumbar region without mention of lumbar back pain or lower extremity pain: Secondary | ICD-10-CM | POA: Diagnosis not present

## 2023-09-15 DIAGNOSIS — F419 Anxiety disorder, unspecified: Secondary | ICD-10-CM | POA: Diagnosis not present

## 2023-09-15 DIAGNOSIS — F02818 Dementia in other diseases classified elsewhere, unspecified severity, with other behavioral disturbance: Secondary | ICD-10-CM | POA: Diagnosis not present

## 2023-09-15 DIAGNOSIS — K5790 Diverticulosis of intestine, part unspecified, without perforation or abscess without bleeding: Secondary | ICD-10-CM | POA: Diagnosis not present

## 2023-09-15 DIAGNOSIS — G629 Polyneuropathy, unspecified: Secondary | ICD-10-CM | POA: Diagnosis not present

## 2023-09-15 DIAGNOSIS — Z8781 Personal history of (healed) traumatic fracture: Secondary | ICD-10-CM | POA: Diagnosis not present

## 2023-09-15 DIAGNOSIS — I5032 Chronic diastolic (congestive) heart failure: Secondary | ICD-10-CM | POA: Diagnosis not present

## 2023-09-15 DIAGNOSIS — F339 Major depressive disorder, recurrent, unspecified: Secondary | ICD-10-CM | POA: Diagnosis not present

## 2023-09-15 DIAGNOSIS — F0284 Dementia in other diseases classified elsewhere, unspecified severity, with anxiety: Secondary | ICD-10-CM | POA: Diagnosis not present

## 2023-09-15 DIAGNOSIS — I110 Hypertensive heart disease with heart failure: Secondary | ICD-10-CM | POA: Diagnosis not present

## 2023-09-15 DIAGNOSIS — E782 Mixed hyperlipidemia: Secondary | ICD-10-CM | POA: Diagnosis not present

## 2023-09-15 DIAGNOSIS — R296 Repeated falls: Secondary | ICD-10-CM | POA: Diagnosis not present

## 2023-09-15 DIAGNOSIS — G20C Parkinsonism, unspecified: Secondary | ICD-10-CM | POA: Diagnosis not present

## 2023-09-15 DIAGNOSIS — G4700 Insomnia, unspecified: Secondary | ICD-10-CM | POA: Diagnosis not present

## 2023-09-15 DIAGNOSIS — G319 Degenerative disease of nervous system, unspecified: Secondary | ICD-10-CM | POA: Diagnosis not present

## 2023-09-15 DIAGNOSIS — M48061 Spinal stenosis, lumbar region without neurogenic claudication: Secondary | ICD-10-CM | POA: Diagnosis not present

## 2023-09-16 DIAGNOSIS — M48061 Spinal stenosis, lumbar region without neurogenic claudication: Secondary | ICD-10-CM | POA: Diagnosis not present

## 2023-09-16 DIAGNOSIS — F0284 Dementia in other diseases classified elsewhere, unspecified severity, with anxiety: Secondary | ICD-10-CM | POA: Diagnosis not present

## 2023-09-16 DIAGNOSIS — G319 Degenerative disease of nervous system, unspecified: Secondary | ICD-10-CM | POA: Diagnosis not present

## 2023-09-16 DIAGNOSIS — M51369 Other intervertebral disc degeneration, lumbar region without mention of lumbar back pain or lower extremity pain: Secondary | ICD-10-CM | POA: Diagnosis not present

## 2023-09-16 DIAGNOSIS — F02818 Dementia in other diseases classified elsewhere, unspecified severity, with other behavioral disturbance: Secondary | ICD-10-CM | POA: Diagnosis not present

## 2023-09-16 DIAGNOSIS — G20C Parkinsonism, unspecified: Secondary | ICD-10-CM | POA: Diagnosis not present

## 2023-09-23 DIAGNOSIS — G20C Parkinsonism, unspecified: Secondary | ICD-10-CM | POA: Diagnosis not present

## 2023-09-23 DIAGNOSIS — F0284 Dementia in other diseases classified elsewhere, unspecified severity, with anxiety: Secondary | ICD-10-CM | POA: Diagnosis not present

## 2023-09-23 DIAGNOSIS — M48061 Spinal stenosis, lumbar region without neurogenic claudication: Secondary | ICD-10-CM | POA: Diagnosis not present

## 2023-09-23 DIAGNOSIS — G319 Degenerative disease of nervous system, unspecified: Secondary | ICD-10-CM | POA: Diagnosis not present

## 2023-09-23 DIAGNOSIS — F02818 Dementia in other diseases classified elsewhere, unspecified severity, with other behavioral disturbance: Secondary | ICD-10-CM | POA: Diagnosis not present

## 2023-09-23 DIAGNOSIS — M51369 Other intervertebral disc degeneration, lumbar region without mention of lumbar back pain or lower extremity pain: Secondary | ICD-10-CM | POA: Diagnosis not present

## 2023-09-29 DIAGNOSIS — M51369 Other intervertebral disc degeneration, lumbar region without mention of lumbar back pain or lower extremity pain: Secondary | ICD-10-CM | POA: Diagnosis not present

## 2023-09-29 DIAGNOSIS — F0284 Dementia in other diseases classified elsewhere, unspecified severity, with anxiety: Secondary | ICD-10-CM | POA: Diagnosis not present

## 2023-09-29 DIAGNOSIS — G20C Parkinsonism, unspecified: Secondary | ICD-10-CM | POA: Diagnosis not present

## 2023-09-29 DIAGNOSIS — G319 Degenerative disease of nervous system, unspecified: Secondary | ICD-10-CM | POA: Diagnosis not present

## 2023-09-29 DIAGNOSIS — M48061 Spinal stenosis, lumbar region without neurogenic claudication: Secondary | ICD-10-CM | POA: Diagnosis not present

## 2023-09-29 DIAGNOSIS — F02818 Dementia in other diseases classified elsewhere, unspecified severity, with other behavioral disturbance: Secondary | ICD-10-CM | POA: Diagnosis not present

## 2023-10-05 DIAGNOSIS — G20A1 Parkinson's disease without dyskinesia, without mention of fluctuations: Secondary | ICD-10-CM | POA: Diagnosis not present

## 2023-10-05 DIAGNOSIS — G319 Degenerative disease of nervous system, unspecified: Secondary | ICD-10-CM | POA: Diagnosis not present

## 2023-10-05 DIAGNOSIS — F02818 Dementia in other diseases classified elsewhere, unspecified severity, with other behavioral disturbance: Secondary | ICD-10-CM | POA: Diagnosis not present

## 2023-10-05 DIAGNOSIS — M51369 Other intervertebral disc degeneration, lumbar region without mention of lumbar back pain or lower extremity pain: Secondary | ICD-10-CM | POA: Diagnosis not present

## 2023-10-05 DIAGNOSIS — F0284 Dementia in other diseases classified elsewhere, unspecified severity, with anxiety: Secondary | ICD-10-CM | POA: Diagnosis not present

## 2023-10-05 DIAGNOSIS — N401 Enlarged prostate with lower urinary tract symptoms: Secondary | ICD-10-CM | POA: Diagnosis not present

## 2023-10-05 DIAGNOSIS — E782 Mixed hyperlipidemia: Secondary | ICD-10-CM | POA: Diagnosis not present

## 2023-10-05 DIAGNOSIS — I10 Essential (primary) hypertension: Secondary | ICD-10-CM | POA: Diagnosis not present

## 2023-10-05 DIAGNOSIS — M48061 Spinal stenosis, lumbar region without neurogenic claudication: Secondary | ICD-10-CM | POA: Diagnosis not present

## 2023-10-05 DIAGNOSIS — G20C Parkinsonism, unspecified: Secondary | ICD-10-CM | POA: Diagnosis not present

## 2023-10-06 DIAGNOSIS — I10 Essential (primary) hypertension: Secondary | ICD-10-CM | POA: Diagnosis not present

## 2023-10-06 DIAGNOSIS — Z7189 Other specified counseling: Secondary | ICD-10-CM | POA: Diagnosis not present

## 2023-10-06 DIAGNOSIS — F339 Major depressive disorder, recurrent, unspecified: Secondary | ICD-10-CM | POA: Diagnosis not present

## 2023-10-06 DIAGNOSIS — M51369 Other intervertebral disc degeneration, lumbar region without mention of lumbar back pain or lower extremity pain: Secondary | ICD-10-CM | POA: Diagnosis not present

## 2023-10-06 DIAGNOSIS — M48061 Spinal stenosis, lumbar region without neurogenic claudication: Secondary | ICD-10-CM | POA: Diagnosis not present

## 2023-10-06 DIAGNOSIS — G319 Degenerative disease of nervous system, unspecified: Secondary | ICD-10-CM | POA: Diagnosis not present

## 2023-10-06 DIAGNOSIS — G20C Parkinsonism, unspecified: Secondary | ICD-10-CM | POA: Diagnosis not present

## 2023-10-06 DIAGNOSIS — N401 Enlarged prostate with lower urinary tract symptoms: Secondary | ICD-10-CM | POA: Diagnosis not present

## 2023-10-06 DIAGNOSIS — E782 Mixed hyperlipidemia: Secondary | ICD-10-CM | POA: Diagnosis not present

## 2023-10-06 DIAGNOSIS — G479 Sleep disorder, unspecified: Secondary | ICD-10-CM | POA: Diagnosis not present

## 2023-10-06 DIAGNOSIS — G20A1 Parkinson's disease without dyskinesia, without mention of fluctuations: Secondary | ICD-10-CM | POA: Diagnosis not present

## 2023-10-06 DIAGNOSIS — F0284 Dementia in other diseases classified elsewhere, unspecified severity, with anxiety: Secondary | ICD-10-CM | POA: Diagnosis not present

## 2023-10-06 DIAGNOSIS — F02818 Dementia in other diseases classified elsewhere, unspecified severity, with other behavioral disturbance: Secondary | ICD-10-CM | POA: Diagnosis not present

## 2023-10-12 DIAGNOSIS — G319 Degenerative disease of nervous system, unspecified: Secondary | ICD-10-CM | POA: Diagnosis not present

## 2023-10-12 DIAGNOSIS — G20C Parkinsonism, unspecified: Secondary | ICD-10-CM | POA: Diagnosis not present

## 2023-10-12 DIAGNOSIS — M51369 Other intervertebral disc degeneration, lumbar region without mention of lumbar back pain or lower extremity pain: Secondary | ICD-10-CM | POA: Diagnosis not present

## 2023-10-12 DIAGNOSIS — F0284 Dementia in other diseases classified elsewhere, unspecified severity, with anxiety: Secondary | ICD-10-CM | POA: Diagnosis not present

## 2023-10-12 DIAGNOSIS — M48061 Spinal stenosis, lumbar region without neurogenic claudication: Secondary | ICD-10-CM | POA: Diagnosis not present

## 2023-10-12 DIAGNOSIS — F02818 Dementia in other diseases classified elsewhere, unspecified severity, with other behavioral disturbance: Secondary | ICD-10-CM | POA: Diagnosis not present

## 2023-10-15 DIAGNOSIS — F0284 Dementia in other diseases classified elsewhere, unspecified severity, with anxiety: Secondary | ICD-10-CM | POA: Diagnosis not present

## 2023-10-15 DIAGNOSIS — M48061 Spinal stenosis, lumbar region without neurogenic claudication: Secondary | ICD-10-CM | POA: Diagnosis not present

## 2023-10-15 DIAGNOSIS — R296 Repeated falls: Secondary | ICD-10-CM | POA: Diagnosis not present

## 2023-10-15 DIAGNOSIS — G20C Parkinsonism, unspecified: Secondary | ICD-10-CM | POA: Diagnosis not present

## 2023-10-15 DIAGNOSIS — F02818 Dementia in other diseases classified elsewhere, unspecified severity, with other behavioral disturbance: Secondary | ICD-10-CM | POA: Diagnosis not present

## 2023-10-15 DIAGNOSIS — G629 Polyneuropathy, unspecified: Secondary | ICD-10-CM | POA: Diagnosis not present

## 2023-10-15 DIAGNOSIS — F339 Major depressive disorder, recurrent, unspecified: Secondary | ICD-10-CM | POA: Diagnosis not present

## 2023-10-15 DIAGNOSIS — F419 Anxiety disorder, unspecified: Secondary | ICD-10-CM | POA: Diagnosis not present

## 2023-10-15 DIAGNOSIS — M51369 Other intervertebral disc degeneration, lumbar region without mention of lumbar back pain or lower extremity pain: Secondary | ICD-10-CM | POA: Diagnosis not present

## 2023-10-15 DIAGNOSIS — I110 Hypertensive heart disease with heart failure: Secondary | ICD-10-CM | POA: Diagnosis not present

## 2023-10-15 DIAGNOSIS — G319 Degenerative disease of nervous system, unspecified: Secondary | ICD-10-CM | POA: Diagnosis not present

## 2023-10-15 DIAGNOSIS — I5032 Chronic diastolic (congestive) heart failure: Secondary | ICD-10-CM | POA: Diagnosis not present

## 2023-10-15 DIAGNOSIS — Z9181 History of falling: Secondary | ICD-10-CM | POA: Diagnosis not present

## 2023-10-15 DIAGNOSIS — Z8781 Personal history of (healed) traumatic fracture: Secondary | ICD-10-CM | POA: Diagnosis not present

## 2023-10-15 DIAGNOSIS — E559 Vitamin D deficiency, unspecified: Secondary | ICD-10-CM | POA: Diagnosis not present

## 2023-10-15 DIAGNOSIS — G4700 Insomnia, unspecified: Secondary | ICD-10-CM | POA: Diagnosis not present

## 2023-10-15 DIAGNOSIS — E782 Mixed hyperlipidemia: Secondary | ICD-10-CM | POA: Diagnosis not present

## 2023-10-15 DIAGNOSIS — G2581 Restless legs syndrome: Secondary | ICD-10-CM | POA: Diagnosis not present

## 2023-10-15 DIAGNOSIS — K5790 Diverticulosis of intestine, part unspecified, without perforation or abscess without bleeding: Secondary | ICD-10-CM | POA: Diagnosis not present

## 2023-10-18 DIAGNOSIS — F0284 Dementia in other diseases classified elsewhere, unspecified severity, with anxiety: Secondary | ICD-10-CM | POA: Diagnosis not present

## 2023-10-18 DIAGNOSIS — G319 Degenerative disease of nervous system, unspecified: Secondary | ICD-10-CM | POA: Diagnosis not present

## 2023-10-18 DIAGNOSIS — G20C Parkinsonism, unspecified: Secondary | ICD-10-CM | POA: Diagnosis not present

## 2023-10-18 DIAGNOSIS — F02818 Dementia in other diseases classified elsewhere, unspecified severity, with other behavioral disturbance: Secondary | ICD-10-CM | POA: Diagnosis not present

## 2023-10-18 DIAGNOSIS — M48061 Spinal stenosis, lumbar region without neurogenic claudication: Secondary | ICD-10-CM | POA: Diagnosis not present

## 2023-10-18 DIAGNOSIS — M51369 Other intervertebral disc degeneration, lumbar region without mention of lumbar back pain or lower extremity pain: Secondary | ICD-10-CM | POA: Diagnosis not present

## 2023-10-22 DIAGNOSIS — F0284 Dementia in other diseases classified elsewhere, unspecified severity, with anxiety: Secondary | ICD-10-CM | POA: Diagnosis not present

## 2023-10-22 DIAGNOSIS — M48061 Spinal stenosis, lumbar region without neurogenic claudication: Secondary | ICD-10-CM | POA: Diagnosis not present

## 2023-10-22 DIAGNOSIS — G20C Parkinsonism, unspecified: Secondary | ICD-10-CM | POA: Diagnosis not present

## 2023-10-22 DIAGNOSIS — G319 Degenerative disease of nervous system, unspecified: Secondary | ICD-10-CM | POA: Diagnosis not present

## 2023-10-22 DIAGNOSIS — M51369 Other intervertebral disc degeneration, lumbar region without mention of lumbar back pain or lower extremity pain: Secondary | ICD-10-CM | POA: Diagnosis not present

## 2023-10-22 DIAGNOSIS — F02818 Dementia in other diseases classified elsewhere, unspecified severity, with other behavioral disturbance: Secondary | ICD-10-CM | POA: Diagnosis not present

## 2023-10-26 DIAGNOSIS — M51369 Other intervertebral disc degeneration, lumbar region without mention of lumbar back pain or lower extremity pain: Secondary | ICD-10-CM | POA: Diagnosis not present

## 2023-10-26 DIAGNOSIS — F02818 Dementia in other diseases classified elsewhere, unspecified severity, with other behavioral disturbance: Secondary | ICD-10-CM | POA: Diagnosis not present

## 2023-10-26 DIAGNOSIS — F0284 Dementia in other diseases classified elsewhere, unspecified severity, with anxiety: Secondary | ICD-10-CM | POA: Diagnosis not present

## 2023-10-26 DIAGNOSIS — G20C Parkinsonism, unspecified: Secondary | ICD-10-CM | POA: Diagnosis not present

## 2023-10-26 DIAGNOSIS — M48061 Spinal stenosis, lumbar region without neurogenic claudication: Secondary | ICD-10-CM | POA: Diagnosis not present

## 2023-10-26 DIAGNOSIS — G319 Degenerative disease of nervous system, unspecified: Secondary | ICD-10-CM | POA: Diagnosis not present

## 2023-10-27 DIAGNOSIS — N401 Enlarged prostate with lower urinary tract symptoms: Secondary | ICD-10-CM | POA: Diagnosis not present

## 2023-10-27 DIAGNOSIS — G479 Sleep disorder, unspecified: Secondary | ICD-10-CM | POA: Diagnosis not present

## 2023-10-27 DIAGNOSIS — F339 Major depressive disorder, recurrent, unspecified: Secondary | ICD-10-CM | POA: Diagnosis not present

## 2023-10-27 DIAGNOSIS — F02818 Dementia in other diseases classified elsewhere, unspecified severity, with other behavioral disturbance: Secondary | ICD-10-CM | POA: Diagnosis not present

## 2023-10-27 DIAGNOSIS — I10 Essential (primary) hypertension: Secondary | ICD-10-CM | POA: Diagnosis not present

## 2023-10-27 DIAGNOSIS — G20C Parkinsonism, unspecified: Secondary | ICD-10-CM | POA: Diagnosis not present

## 2023-10-27 DIAGNOSIS — M51369 Other intervertebral disc degeneration, lumbar region without mention of lumbar back pain or lower extremity pain: Secondary | ICD-10-CM | POA: Diagnosis not present

## 2023-10-27 DIAGNOSIS — M48061 Spinal stenosis, lumbar region without neurogenic claudication: Secondary | ICD-10-CM | POA: Diagnosis not present

## 2023-10-27 DIAGNOSIS — E782 Mixed hyperlipidemia: Secondary | ICD-10-CM | POA: Diagnosis not present

## 2023-10-27 DIAGNOSIS — G20A1 Parkinson's disease without dyskinesia, without mention of fluctuations: Secondary | ICD-10-CM | POA: Diagnosis not present

## 2023-10-27 DIAGNOSIS — F0284 Dementia in other diseases classified elsewhere, unspecified severity, with anxiety: Secondary | ICD-10-CM | POA: Diagnosis not present

## 2023-10-27 DIAGNOSIS — G319 Degenerative disease of nervous system, unspecified: Secondary | ICD-10-CM | POA: Diagnosis not present

## 2023-11-02 DIAGNOSIS — F0284 Dementia in other diseases classified elsewhere, unspecified severity, with anxiety: Secondary | ICD-10-CM | POA: Diagnosis not present

## 2023-11-02 DIAGNOSIS — M48061 Spinal stenosis, lumbar region without neurogenic claudication: Secondary | ICD-10-CM | POA: Diagnosis not present

## 2023-11-02 DIAGNOSIS — G319 Degenerative disease of nervous system, unspecified: Secondary | ICD-10-CM | POA: Diagnosis not present

## 2023-11-02 DIAGNOSIS — F02818 Dementia in other diseases classified elsewhere, unspecified severity, with other behavioral disturbance: Secondary | ICD-10-CM | POA: Diagnosis not present

## 2023-11-02 DIAGNOSIS — G20C Parkinsonism, unspecified: Secondary | ICD-10-CM | POA: Diagnosis not present

## 2023-11-02 DIAGNOSIS — M51369 Other intervertebral disc degeneration, lumbar region without mention of lumbar back pain or lower extremity pain: Secondary | ICD-10-CM | POA: Diagnosis not present

## 2023-11-05 DIAGNOSIS — G20A1 Parkinson's disease without dyskinesia, without mention of fluctuations: Secondary | ICD-10-CM | POA: Diagnosis not present

## 2023-11-05 DIAGNOSIS — F339 Major depressive disorder, recurrent, unspecified: Secondary | ICD-10-CM | POA: Diagnosis not present

## 2023-11-05 DIAGNOSIS — I10 Essential (primary) hypertension: Secondary | ICD-10-CM | POA: Diagnosis not present

## 2023-11-05 DIAGNOSIS — E782 Mixed hyperlipidemia: Secondary | ICD-10-CM | POA: Diagnosis not present

## 2023-11-09 DIAGNOSIS — F02818 Dementia in other diseases classified elsewhere, unspecified severity, with other behavioral disturbance: Secondary | ICD-10-CM | POA: Diagnosis not present

## 2023-11-09 DIAGNOSIS — F0284 Dementia in other diseases classified elsewhere, unspecified severity, with anxiety: Secondary | ICD-10-CM | POA: Diagnosis not present

## 2023-11-09 DIAGNOSIS — G20C Parkinsonism, unspecified: Secondary | ICD-10-CM | POA: Diagnosis not present

## 2023-11-09 DIAGNOSIS — G319 Degenerative disease of nervous system, unspecified: Secondary | ICD-10-CM | POA: Diagnosis not present

## 2023-11-09 DIAGNOSIS — M48061 Spinal stenosis, lumbar region without neurogenic claudication: Secondary | ICD-10-CM | POA: Diagnosis not present

## 2023-11-09 DIAGNOSIS — M51369 Other intervertebral disc degeneration, lumbar region without mention of lumbar back pain or lower extremity pain: Secondary | ICD-10-CM | POA: Diagnosis not present

## 2023-11-14 DIAGNOSIS — F339 Major depressive disorder, recurrent, unspecified: Secondary | ICD-10-CM | POA: Diagnosis not present

## 2023-11-14 DIAGNOSIS — G2581 Restless legs syndrome: Secondary | ICD-10-CM | POA: Diagnosis not present

## 2023-11-14 DIAGNOSIS — Z8781 Personal history of (healed) traumatic fracture: Secondary | ICD-10-CM | POA: Diagnosis not present

## 2023-11-14 DIAGNOSIS — G20C Parkinsonism, unspecified: Secondary | ICD-10-CM | POA: Diagnosis not present

## 2023-11-14 DIAGNOSIS — F0284 Dementia in other diseases classified elsewhere, unspecified severity, with anxiety: Secondary | ICD-10-CM | POA: Diagnosis not present

## 2023-11-14 DIAGNOSIS — R296 Repeated falls: Secondary | ICD-10-CM | POA: Diagnosis not present

## 2023-11-14 DIAGNOSIS — G629 Polyneuropathy, unspecified: Secondary | ICD-10-CM | POA: Diagnosis not present

## 2023-11-14 DIAGNOSIS — K5790 Diverticulosis of intestine, part unspecified, without perforation or abscess without bleeding: Secondary | ICD-10-CM | POA: Diagnosis not present

## 2023-11-14 DIAGNOSIS — G319 Degenerative disease of nervous system, unspecified: Secondary | ICD-10-CM | POA: Diagnosis not present

## 2023-11-14 DIAGNOSIS — F419 Anxiety disorder, unspecified: Secondary | ICD-10-CM | POA: Diagnosis not present

## 2023-11-14 DIAGNOSIS — M51369 Other intervertebral disc degeneration, lumbar region without mention of lumbar back pain or lower extremity pain: Secondary | ICD-10-CM | POA: Diagnosis not present

## 2023-11-14 DIAGNOSIS — I110 Hypertensive heart disease with heart failure: Secondary | ICD-10-CM | POA: Diagnosis not present

## 2023-11-14 DIAGNOSIS — E782 Mixed hyperlipidemia: Secondary | ICD-10-CM | POA: Diagnosis not present

## 2023-11-14 DIAGNOSIS — G4700 Insomnia, unspecified: Secondary | ICD-10-CM | POA: Diagnosis not present

## 2023-11-14 DIAGNOSIS — M48061 Spinal stenosis, lumbar region without neurogenic claudication: Secondary | ICD-10-CM | POA: Diagnosis not present

## 2023-11-14 DIAGNOSIS — E559 Vitamin D deficiency, unspecified: Secondary | ICD-10-CM | POA: Diagnosis not present

## 2023-11-14 DIAGNOSIS — Z9181 History of falling: Secondary | ICD-10-CM | POA: Diagnosis not present

## 2023-11-14 DIAGNOSIS — F02818 Dementia in other diseases classified elsewhere, unspecified severity, with other behavioral disturbance: Secondary | ICD-10-CM | POA: Diagnosis not present

## 2023-11-14 DIAGNOSIS — I5032 Chronic diastolic (congestive) heart failure: Secondary | ICD-10-CM | POA: Diagnosis not present

## 2023-11-15 DIAGNOSIS — I10 Essential (primary) hypertension: Secondary | ICD-10-CM | POA: Diagnosis not present

## 2023-11-15 DIAGNOSIS — E559 Vitamin D deficiency, unspecified: Secondary | ICD-10-CM | POA: Diagnosis not present

## 2023-11-15 DIAGNOSIS — R5383 Other fatigue: Secondary | ICD-10-CM | POA: Diagnosis not present

## 2023-11-15 DIAGNOSIS — D559 Anemia due to enzyme disorder, unspecified: Secondary | ICD-10-CM | POA: Diagnosis not present

## 2023-11-15 DIAGNOSIS — D519 Vitamin B12 deficiency anemia, unspecified: Secondary | ICD-10-CM | POA: Diagnosis not present

## 2023-11-15 DIAGNOSIS — Z1329 Encounter for screening for other suspected endocrine disorder: Secondary | ICD-10-CM | POA: Diagnosis not present

## 2023-11-15 DIAGNOSIS — E7849 Other hyperlipidemia: Secondary | ICD-10-CM | POA: Diagnosis not present

## 2023-11-15 DIAGNOSIS — Z0001 Encounter for general adult medical examination with abnormal findings: Secondary | ICD-10-CM | POA: Diagnosis not present

## 2023-11-17 DIAGNOSIS — G20C Parkinsonism, unspecified: Secondary | ICD-10-CM | POA: Diagnosis not present

## 2023-11-17 DIAGNOSIS — G319 Degenerative disease of nervous system, unspecified: Secondary | ICD-10-CM | POA: Diagnosis not present

## 2023-11-17 DIAGNOSIS — M51369 Other intervertebral disc degeneration, lumbar region without mention of lumbar back pain or lower extremity pain: Secondary | ICD-10-CM | POA: Diagnosis not present

## 2023-11-17 DIAGNOSIS — M48061 Spinal stenosis, lumbar region without neurogenic claudication: Secondary | ICD-10-CM | POA: Diagnosis not present

## 2023-11-17 DIAGNOSIS — F0284 Dementia in other diseases classified elsewhere, unspecified severity, with anxiety: Secondary | ICD-10-CM | POA: Diagnosis not present

## 2023-11-17 DIAGNOSIS — F02818 Dementia in other diseases classified elsewhere, unspecified severity, with other behavioral disturbance: Secondary | ICD-10-CM | POA: Diagnosis not present

## 2023-11-22 DIAGNOSIS — I10 Essential (primary) hypertension: Secondary | ICD-10-CM | POA: Diagnosis not present

## 2023-11-22 DIAGNOSIS — G629 Polyneuropathy, unspecified: Secondary | ICD-10-CM | POA: Diagnosis not present

## 2023-11-22 DIAGNOSIS — F339 Major depressive disorder, recurrent, unspecified: Secondary | ICD-10-CM | POA: Diagnosis not present

## 2023-11-22 DIAGNOSIS — R296 Repeated falls: Secondary | ICD-10-CM | POA: Diagnosis not present

## 2023-11-22 DIAGNOSIS — M48061 Spinal stenosis, lumbar region without neurogenic claudication: Secondary | ICD-10-CM | POA: Diagnosis not present

## 2023-11-22 DIAGNOSIS — Z23 Encounter for immunization: Secondary | ICD-10-CM | POA: Diagnosis not present

## 2023-11-22 DIAGNOSIS — Z6821 Body mass index (BMI) 21.0-21.9, adult: Secondary | ICD-10-CM | POA: Diagnosis not present

## 2023-11-22 DIAGNOSIS — G319 Degenerative disease of nervous system, unspecified: Secondary | ICD-10-CM | POA: Diagnosis not present

## 2023-11-22 DIAGNOSIS — S51812A Laceration without foreign body of left forearm, initial encounter: Secondary | ICD-10-CM | POA: Diagnosis not present

## 2023-11-23 DIAGNOSIS — M51369 Other intervertebral disc degeneration, lumbar region without mention of lumbar back pain or lower extremity pain: Secondary | ICD-10-CM | POA: Diagnosis not present

## 2023-11-23 DIAGNOSIS — G20C Parkinsonism, unspecified: Secondary | ICD-10-CM | POA: Diagnosis not present

## 2023-11-23 DIAGNOSIS — F0284 Dementia in other diseases classified elsewhere, unspecified severity, with anxiety: Secondary | ICD-10-CM | POA: Diagnosis not present

## 2023-11-23 DIAGNOSIS — F02818 Dementia in other diseases classified elsewhere, unspecified severity, with other behavioral disturbance: Secondary | ICD-10-CM | POA: Diagnosis not present

## 2023-11-23 DIAGNOSIS — M48061 Spinal stenosis, lumbar region without neurogenic claudication: Secondary | ICD-10-CM | POA: Diagnosis not present

## 2023-11-23 DIAGNOSIS — G319 Degenerative disease of nervous system, unspecified: Secondary | ICD-10-CM | POA: Diagnosis not present

## 2023-11-24 DIAGNOSIS — E782 Mixed hyperlipidemia: Secondary | ICD-10-CM | POA: Diagnosis not present

## 2023-11-24 DIAGNOSIS — G479 Sleep disorder, unspecified: Secondary | ICD-10-CM | POA: Diagnosis not present

## 2023-11-24 DIAGNOSIS — I10 Essential (primary) hypertension: Secondary | ICD-10-CM | POA: Diagnosis not present

## 2023-11-24 DIAGNOSIS — G20A1 Parkinson's disease without dyskinesia, without mention of fluctuations: Secondary | ICD-10-CM | POA: Diagnosis not present

## 2023-11-24 DIAGNOSIS — N401 Enlarged prostate with lower urinary tract symptoms: Secondary | ICD-10-CM | POA: Diagnosis not present

## 2023-11-24 DIAGNOSIS — F339 Major depressive disorder, recurrent, unspecified: Secondary | ICD-10-CM | POA: Diagnosis not present

## 2023-11-30 DIAGNOSIS — M48061 Spinal stenosis, lumbar region without neurogenic claudication: Secondary | ICD-10-CM | POA: Diagnosis not present

## 2023-11-30 DIAGNOSIS — F0284 Dementia in other diseases classified elsewhere, unspecified severity, with anxiety: Secondary | ICD-10-CM | POA: Diagnosis not present

## 2023-11-30 DIAGNOSIS — G20C Parkinsonism, unspecified: Secondary | ICD-10-CM | POA: Diagnosis not present

## 2023-11-30 DIAGNOSIS — F02818 Dementia in other diseases classified elsewhere, unspecified severity, with other behavioral disturbance: Secondary | ICD-10-CM | POA: Diagnosis not present

## 2023-11-30 DIAGNOSIS — M51369 Other intervertebral disc degeneration, lumbar region without mention of lumbar back pain or lower extremity pain: Secondary | ICD-10-CM | POA: Diagnosis not present

## 2023-11-30 DIAGNOSIS — G319 Degenerative disease of nervous system, unspecified: Secondary | ICD-10-CM | POA: Diagnosis not present

## 2023-12-05 DIAGNOSIS — E782 Mixed hyperlipidemia: Secondary | ICD-10-CM | POA: Diagnosis not present

## 2023-12-05 DIAGNOSIS — G20A1 Parkinson's disease without dyskinesia, without mention of fluctuations: Secondary | ICD-10-CM | POA: Diagnosis not present

## 2023-12-05 DIAGNOSIS — N401 Enlarged prostate with lower urinary tract symptoms: Secondary | ICD-10-CM | POA: Diagnosis not present

## 2023-12-05 DIAGNOSIS — I10 Essential (primary) hypertension: Secondary | ICD-10-CM | POA: Diagnosis not present

## 2023-12-08 DIAGNOSIS — F02818 Dementia in other diseases classified elsewhere, unspecified severity, with other behavioral disturbance: Secondary | ICD-10-CM | POA: Diagnosis not present

## 2023-12-08 DIAGNOSIS — F0284 Dementia in other diseases classified elsewhere, unspecified severity, with anxiety: Secondary | ICD-10-CM | POA: Diagnosis not present

## 2023-12-08 DIAGNOSIS — G20C Parkinsonism, unspecified: Secondary | ICD-10-CM | POA: Diagnosis not present

## 2023-12-08 DIAGNOSIS — G319 Degenerative disease of nervous system, unspecified: Secondary | ICD-10-CM | POA: Diagnosis not present

## 2023-12-08 DIAGNOSIS — M51369 Other intervertebral disc degeneration, lumbar region without mention of lumbar back pain or lower extremity pain: Secondary | ICD-10-CM | POA: Diagnosis not present

## 2023-12-08 DIAGNOSIS — M48061 Spinal stenosis, lumbar region without neurogenic claudication: Secondary | ICD-10-CM | POA: Diagnosis not present

## 2023-12-22 DIAGNOSIS — G20A1 Parkinson's disease without dyskinesia, without mention of fluctuations: Secondary | ICD-10-CM | POA: Diagnosis not present

## 2023-12-22 DIAGNOSIS — F339 Major depressive disorder, recurrent, unspecified: Secondary | ICD-10-CM | POA: Diagnosis not present

## 2023-12-22 DIAGNOSIS — N401 Enlarged prostate with lower urinary tract symptoms: Secondary | ICD-10-CM | POA: Diagnosis not present

## 2023-12-22 DIAGNOSIS — G479 Sleep disorder, unspecified: Secondary | ICD-10-CM | POA: Diagnosis not present

## 2023-12-22 DIAGNOSIS — E782 Mixed hyperlipidemia: Secondary | ICD-10-CM | POA: Diagnosis not present

## 2023-12-22 DIAGNOSIS — I10 Essential (primary) hypertension: Secondary | ICD-10-CM | POA: Diagnosis not present
